# Patient Record
Sex: Female | Born: 1990 | Race: Black or African American | Hispanic: No | Marital: Single | Smoking: Former smoker
Health system: Southern US, Community
[De-identification: ages and names within clinical notes are randomized; demographics above are authoritative.]

## PROBLEM LIST (undated history)

## (undated) ENCOUNTER — Inpatient Hospital Stay (HOSPITAL_COMMUNITY): Payer: Self-pay

## (undated) DIAGNOSIS — Z789 Other specified health status: Secondary | ICD-10-CM

## (undated) HISTORY — PX: NO PAST SURGERIES: SHX2092

---
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
## 2009-01-01 ENCOUNTER — Emergency Department (HOSPITAL_COMMUNITY): Admission: EM | Admit: 2009-01-01 | Discharge: 2009-01-01 | Payer: Self-pay | Admitting: Emergency Medicine

## 2009-09-27 ENCOUNTER — Emergency Department: Payer: Self-pay | Admitting: Emergency Medicine

## 2009-09-29 ENCOUNTER — Inpatient Hospital Stay (HOSPITAL_COMMUNITY): Admission: AD | Admit: 2009-09-29 | Discharge: 2009-09-29 | Payer: Self-pay | Admitting: Obstetrics and Gynecology

## 2009-10-04 ENCOUNTER — Emergency Department: Payer: Self-pay | Admitting: Emergency Medicine

## 2009-10-05 ENCOUNTER — Emergency Department (HOSPITAL_COMMUNITY): Admission: EM | Admit: 2009-10-05 | Discharge: 2009-10-05 | Payer: Self-pay | Admitting: Family Medicine

## 2009-10-06 ENCOUNTER — Emergency Department: Payer: Self-pay | Admitting: Emergency Medicine

## 2009-10-10 ENCOUNTER — Encounter: Payer: Self-pay | Admitting: Family Medicine

## 2009-10-10 ENCOUNTER — Ambulatory Visit: Payer: Self-pay | Admitting: Obstetrics and Gynecology

## 2009-10-10 LAB — CONVERTED CEMR LAB
Antibody Screen: NEGATIVE
Basophils Relative: 0 % (ref 0–1)
Eosinophils Absolute: 0.1 10*3/uL (ref 0.0–0.7)
Hgb A: 97.3 % (ref 96.8–97.8)
Hgb S Quant: 0 % (ref 0.0–0.0)
Lymphs Abs: 1.4 10*3/uL (ref 0.7–4.0)
MCHC: 33.1 g/dL (ref 30.0–36.0)
MCV: 87.1 fL (ref 78.0–100.0)
Monocytes Relative: 5 % (ref 3–12)
Neutrophils Relative %: 75 % (ref 43–77)
RBC: 4.72 M/uL (ref 3.87–5.11)
Rh Type: POSITIVE

## 2009-10-24 ENCOUNTER — Ambulatory Visit: Payer: Self-pay | Admitting: Obstetrics and Gynecology

## 2009-11-23 ENCOUNTER — Encounter: Payer: Self-pay | Admitting: Family Medicine

## 2009-11-23 ENCOUNTER — Ambulatory Visit: Payer: Self-pay | Admitting: Obstetrics & Gynecology

## 2009-12-15 ENCOUNTER — Inpatient Hospital Stay (HOSPITAL_COMMUNITY): Admission: AD | Admit: 2009-12-15 | Discharge: 2009-12-16 | Payer: Self-pay | Admitting: Obstetrics & Gynecology

## 2009-12-15 ENCOUNTER — Ambulatory Visit: Payer: Self-pay | Admitting: Nurse Practitioner

## 2009-12-19 ENCOUNTER — Ambulatory Visit (HOSPITAL_COMMUNITY): Admission: RE | Admit: 2009-12-19 | Discharge: 2009-12-19 | Payer: Self-pay | Admitting: Family Medicine

## 2009-12-26 ENCOUNTER — Ambulatory Visit: Payer: Self-pay | Admitting: Obstetrics & Gynecology

## 2010-01-16 ENCOUNTER — Inpatient Hospital Stay (HOSPITAL_COMMUNITY): Admission: AD | Admit: 2010-01-16 | Discharge: 2010-01-17 | Payer: Self-pay | Admitting: Family Medicine

## 2010-01-16 ENCOUNTER — Ambulatory Visit: Payer: Self-pay | Admitting: Obstetrics and Gynecology

## 2010-01-24 ENCOUNTER — Ambulatory Visit: Payer: Self-pay | Admitting: Obstetrics & Gynecology

## 2010-02-07 ENCOUNTER — Ambulatory Visit: Payer: Self-pay | Admitting: Obstetrics & Gynecology

## 2010-02-22 ENCOUNTER — Encounter: Payer: Self-pay | Admitting: Family Medicine

## 2010-02-22 ENCOUNTER — Ambulatory Visit: Payer: Self-pay | Admitting: Obstetrics and Gynecology

## 2010-02-22 LAB — CONVERTED CEMR LAB
HCT: 36.4 % (ref 36.0–46.0)
MCHC: 31.9 g/dL (ref 30.0–36.0)
MCV: 91.7 fL (ref 78.0–100.0)
Platelets: 198 10*3/uL (ref 150–400)
RDW: 12.9 % (ref 11.5–15.5)

## 2010-03-03 ENCOUNTER — Inpatient Hospital Stay (HOSPITAL_COMMUNITY): Admission: AD | Admit: 2010-03-03 | Discharge: 2010-03-03 | Payer: Self-pay | Admitting: Obstetrics & Gynecology

## 2010-03-03 ENCOUNTER — Ambulatory Visit: Payer: Self-pay | Admitting: Family Medicine

## 2010-03-15 ENCOUNTER — Ambulatory Visit: Payer: Self-pay | Admitting: Obstetrics & Gynecology

## 2010-04-06 ENCOUNTER — Ambulatory Visit: Payer: Self-pay | Admitting: Obstetrics & Gynecology

## 2010-04-13 ENCOUNTER — Ambulatory Visit: Payer: Self-pay | Admitting: Obstetrics & Gynecology

## 2010-04-20 ENCOUNTER — Ambulatory Visit: Payer: Self-pay | Admitting: Obstetrics & Gynecology

## 2010-04-28 ENCOUNTER — Inpatient Hospital Stay (HOSPITAL_COMMUNITY): Admission: AD | Admit: 2010-04-28 | Discharge: 2010-04-30 | Payer: Self-pay | Admitting: Obstetrics and Gynecology

## 2010-04-28 ENCOUNTER — Ambulatory Visit: Payer: Self-pay | Admitting: Obstetrics & Gynecology

## 2010-05-01 ENCOUNTER — Ambulatory Visit: Payer: Self-pay | Admitting: Obstetrics and Gynecology

## 2010-06-14 ENCOUNTER — Ambulatory Visit: Payer: Self-pay | Admitting: Obstetrics & Gynecology

## 2010-07-30 ENCOUNTER — Encounter: Payer: Self-pay | Admitting: Family Medicine

## 2010-09-20 LAB — CBC
HCT: 34.5 % — ABNORMAL LOW (ref 36.0–46.0)
Hemoglobin: 11.6 g/dL — ABNORMAL LOW (ref 12.0–15.0)
MCH: 29.5 pg (ref 26.0–34.0)
MCHC: 33.7 g/dL (ref 30.0–36.0)
MCV: 87.6 fL (ref 78.0–100.0)

## 2010-09-21 LAB — WET PREP, GENITAL
Clue Cells Wet Prep HPF POC: NONE SEEN
Trich, Wet Prep: NONE SEEN

## 2010-09-21 LAB — URINALYSIS, ROUTINE W REFLEX MICROSCOPIC
Bilirubin Urine: NEGATIVE
Glucose, UA: NEGATIVE mg/dL
Ketones, ur: NEGATIVE mg/dL
Specific Gravity, Urine: >1.03 — ABNORMAL HIGH (ref 1.005–1.030)
pH: 6 (ref 5.0–8.0)

## 2010-09-21 LAB — STREP B DNA PROBE: Strep Group B Ag: POSITIVE

## 2010-09-21 LAB — GC/CHLAMYDIA PROBE AMP, GENITAL: Chlamydia, DNA Probe: NEGATIVE

## 2010-09-24 LAB — URINALYSIS, ROUTINE W REFLEX MICROSCOPIC
Hgb urine dipstick: NEGATIVE
Protein, ur: 30 mg/dL — AB
Urobilinogen, UA: 0.2 mg/dL (ref 0.0–1.0)

## 2010-09-24 LAB — URINE MICROSCOPIC-ADD ON

## 2010-09-24 LAB — GC/CHLAMYDIA PROBE AMP, GENITAL: GC Probe Amp, Genital: NEGATIVE

## 2010-09-24 LAB — WET PREP, GENITAL
Trich, Wet Prep: NONE SEEN
Yeast Wet Prep HPF POC: NONE SEEN

## 2010-09-25 LAB — GC/CHLAMYDIA PROBE AMP, GENITAL
Chlamydia, DNA Probe: NEGATIVE
GC Probe Amp, Genital: NEGATIVE

## 2010-09-25 LAB — CBC
HCT: 35.7 % — ABNORMAL LOW (ref 36.0–46.0)
MCHC: 34.5 g/dL (ref 30.0–36.0)
MCV: 90.1 fL (ref 78.0–100.0)
Platelets: 173 10*3/uL (ref 150–400)

## 2010-09-25 LAB — URINALYSIS, ROUTINE W REFLEX MICROSCOPIC
Bilirubin Urine: NEGATIVE
Hgb urine dipstick: NEGATIVE
Protein, ur: 30 mg/dL — AB
Urobilinogen, UA: 0.2 mg/dL (ref 0.0–1.0)

## 2010-09-25 LAB — WET PREP, GENITAL
Trich, Wet Prep: NONE SEEN
Yeast Wet Prep HPF POC: NONE SEEN

## 2010-10-01 LAB — CBC
HCT: 39.7 % (ref 36.0–46.0)
Hemoglobin: 13.4 g/dL (ref 12.0–15.0)
MCV: 89.6 fL (ref 78.0–100.0)
Platelets: 224 10*3/uL (ref 150–400)
WBC: 8.9 10*3/uL (ref 4.0–10.5)

## 2010-10-01 LAB — URINALYSIS, ROUTINE W REFLEX MICROSCOPIC
Bilirubin Urine: NEGATIVE
Ketones, ur: 15 mg/dL — AB
Nitrite: NEGATIVE
Protein, ur: NEGATIVE mg/dL

## 2010-10-16 LAB — URINE MICROSCOPIC-ADD ON

## 2010-10-16 LAB — URINALYSIS, ROUTINE W REFLEX MICROSCOPIC
Glucose, UA: NEGATIVE mg/dL
Hgb urine dipstick: NEGATIVE
Protein, ur: 30 mg/dL — AB
pH: 6 (ref 5.0–8.0)

## 2011-03-30 LAB — POCT URINALYSIS DIP (DEVICE)
Nitrite: NEGATIVE
Protein, ur: 30 — AB
Urobilinogen, UA: 0.2
pH: 5

## 2011-03-30 LAB — WET PREP, GENITAL

## 2011-03-30 LAB — GC/CHLAMYDIA PROBE AMP, GENITAL
Chlamydia, DNA Probe: POSITIVE — AB
GC Probe Amp, Genital: NEGATIVE

## 2012-08-07 ENCOUNTER — Inpatient Hospital Stay (HOSPITAL_COMMUNITY): Payer: Self-pay

## 2012-08-07 ENCOUNTER — Encounter (HOSPITAL_COMMUNITY): Payer: Self-pay | Admitting: Obstetrics and Gynecology

## 2012-08-07 ENCOUNTER — Inpatient Hospital Stay (HOSPITAL_COMMUNITY)
Admission: AD | Admit: 2012-08-07 | Discharge: 2012-08-07 | Disposition: A | Discharge status: Home / Self Care | Payer: Self-pay | Source: Ambulatory Visit | Attending: Obstetrics & Gynecology | Admitting: Obstetrics & Gynecology

## 2012-08-07 DIAGNOSIS — R1031 Right lower quadrant pain: Secondary | ICD-10-CM | POA: Insufficient documentation

## 2012-08-07 DIAGNOSIS — N949 Unspecified condition associated with female genital organs and menstrual cycle: Secondary | ICD-10-CM

## 2012-08-07 DIAGNOSIS — N910 Primary amenorrhea: Secondary | ICD-10-CM

## 2012-08-07 DIAGNOSIS — N938 Other specified abnormal uterine and vaginal bleeding: Secondary | ICD-10-CM | POA: Insufficient documentation

## 2012-08-07 DIAGNOSIS — R109 Unspecified abdominal pain: Secondary | ICD-10-CM

## 2012-08-07 DIAGNOSIS — M7918 Myalgia, other site: Secondary | ICD-10-CM

## 2012-08-07 HISTORY — DX: Other specified health status: Z78.9

## 2012-08-07 LAB — CBC
HCT: 44.5 % (ref 36.0–46.0)
MCHC: 33.3 g/dL (ref 30.0–36.0)
Platelets: 229 10*3/uL (ref 150–400)
RDW: 12.4 % (ref 11.5–15.5)
WBC: 6.3 10*3/uL (ref 4.0–10.5)

## 2012-08-07 LAB — URINALYSIS, ROUTINE W REFLEX MICROSCOPIC
Hgb urine dipstick: NEGATIVE
Nitrite: NEGATIVE
Protein, ur: NEGATIVE mg/dL
Specific Gravity, Urine: >1.03 — ABNORMAL HIGH (ref 1.005–1.030)
Urobilinogen, UA: 0.2 mg/dL (ref 0.0–1.0)

## 2012-08-07 LAB — POCT PREGNANCY, URINE: Preg Test, Ur: NEGATIVE

## 2012-08-07 LAB — WET PREP, GENITAL

## 2012-08-07 MED ORDER — IBUPROFEN 600 MG PO TABS: 600.0000 mg | ORAL_TABLET | Freq: Four times a day (QID) | ORAL | Status: DC | PRN

## 2012-08-07 NOTE — MAU Note (Signed)
Pt states right sided lower abd pain radiating into low back. Denies bleeding or abnormal vaginal discharge. Denies uti s/s. LMP-06/24/2012, doesn't think she's pregnant, has been stressed out a lot.

## 2012-08-07 NOTE — MAU Provider Note (Signed)
History     CSN: [id number]  Arrival date and time: 08/07/12 1250   First Provider Initiated Contact with Patient 08/07/12 1402      Chief Complaint  Patient presents with  . Back Pain  . Abdominal Pain   HPI  The patient is a 22 y/o female that presents to the MAU with an abdominal/flank pain that has persisted for the last 2 days.  The patient states that her LMP was June 21, 2012 and she first thought she was getting her period.  The patient states that the pain is constant and she rates it as a 4/10.  The patient is located in the RLQ and the right lumbar region.  The patient has taken one 800 mg ibuprofen 2 days ago to no relief.  Nothing makes the pain better but movement and coughing makes it worse.  The patient has not had any bleeding or discharge associated with the pain.  The patient has had no nausea and vomiting associated with the pain.  She reports no fever, chills, night sweats, diplopia, H/A, dizziness, lightheadedness, rashes, dysuria, or constipation.    The patient indicates that she has been under a lot of stress lately and this may cause delay in her menses.  The patient states that she is not pregnant but does not use birth control.  Her last intercourse was 10 days ago.  She indicates no history of STI's.  OB History    Grav Para Term Preterm Abortions TAB SAB Ect Mult Living   2 2 2       2       Past Medical History  Diagnosis Date  . No pertinent past medical history     Past Surgical History  Procedure Date  . No past surgeries     Family History  Problem Relation Age of Onset  . Asthma Sister   . Diabetes Paternal Aunt     History  Substance Use Topics  . Smoking status: Current Every Day Smoker -- 0.2 packs/day for 3 years  . Smokeless tobacco: Not on file  . Alcohol Use: 4.8 oz/week    8 Glasses of wine per week    Allergies:  Allergies  Allergen Reactions  . Bee Venom     Family history of reaction to bee stings    No  prescriptions prior to admission    ROS Physical Exam   Blood pressure 123/79, pulse 87, temperature 98 F (36.7 C), temperature source Oral, resp. rate 16, height 5\' 1"  (1.549 m), weight 140 lb (63.504 kg), last menstrual period 06/24/2012.  Physical Exam  Eyes: PERRLA Heart: RRR, no extra sounds heart Lungs: clear and equal breath sounds in all quadrants ABD: no distention/masses/rashes noted.  BS + in all four quadrants.   Mild enderness to light and deep palpation in the RLQ. No rebound tenderness, no guarding.   M/S: Negative CVA tenderness but some generalized tenderness of right lower back P/V: 2+ pulses in all extremities without edema Pelvic: no lesions noted on the external genitalia.  Cervical os closed with physiologic white discharge in the fornix.  Bimanual: mild tenderness to palpation around the uterus along with mild right adnexal tenderness. No tenderness on the left Neuro: CN II-XII grossly intact Results for orders placed during the hospital encounter of 08/07/12 (from the past 24 hour(s))  URINALYSIS, ROUTINE W REFLEX MICROSCOPIC     Status: Abnormal   Collection Time   08/07/12  1:17 PM  Component Value Range   Color, Urine YELLOW  YELLOW   APPearance CLEAR  CLEAR   Specific Gravity, Urine >1.030 (*) 1.005 - 1.030   pH 6.0  5.0 - 8.0   Glucose, UA NEGATIVE  NEGATIVE mg/dL   Hgb urine dipstick NEGATIVE  NEGATIVE   Bilirubin Urine NEGATIVE  NEGATIVE   Ketones, ur NEGATIVE  NEGATIVE mg/dL   Protein, ur NEGATIVE  NEGATIVE mg/dL   Urobilinogen, UA 0.2  0.0 - 1.0 mg/dL   Nitrite NEGATIVE  NEGATIVE   Leukocytes, UA NEGATIVE  NEGATIVE  POCT PREGNANCY, URINE     Status: Normal   Collection Time   08/07/12  1:27 PM      Component Value Range   Preg Test, Ur NEGATIVE  NEGATIVE  CBC     Status: Normal   Collection Time   08/07/12  1:55 PM      Component Value Range   WBC 6.3  4.0 - 10.5 K/uL   RBC 4.91  3.87 - 5.11 MIL/uL   Hemoglobin 14.8  12.0 - 15.0 g/dL    HCT 16.1  09.6 - 04.5 %   MCV 90.6  78.0 - 100.0 fL   MCH 30.1  26.0 - 34.0 pg   MCHC 33.3  30.0 - 36.0 g/dL   RDW 40.9  81.1 - 91.4 %   Platelets 229  150 - 400 K/uL  WET PREP, GENITAL     Status: Abnormal   Collection Time   08/07/12  2:15 PM      Component Value Range   Yeast Wet Prep HPF POC NONE SEEN  NONE SEEN   Trich, Wet Prep NONE SEEN  NONE SEEN   Clue Cells Wet Prep HPF POC FEW (*) NONE SEEN   WBC, Wet Prep HPF POC FEW (*) NONE SEEN    *RADIOLOGY REPORT*  Clinical Data: Right lower quadrant pain for 2 days. Delayed  menses with negative pregnancy test. LMP 06/21/2012  TRANSABDOMINAL AND TRANSVAGINAL ULTRASOUND OF PELVIS  Technique: Both transabdominal and transvaginal ultrasound  examinations of the pelvis were performed. Transabdominal technique  was performed for global imaging of the pelvis including uterus,  ovaries, adnexal regions, and pelvic cul-de-sac.  It was necessary to proceed with endovaginal exam following the  transabdominal exam to visualize the endometrium and adnexa.  Comparison: None  Findings:  Uterus: Is anteverted and anteflexed and demonstrates a sagittal  length of 7.6 cm, depth of 4.0 cm and width 4.3 cm. A homogeneous  myometrium is seen  Endometrium: Appears trilayered with a width of 7.2 mm. No areas  of focal thickening or heterogeneity are seen  Right ovary: Has a normal appearance measuring 3.6 x 2.8 x 1.6 cm  Left ovary: Has a normal appearance measuring 3.2 x 1.8 x 2.5 cm  Other findings: A small amount of simple free fluid is noted in  the cul-de-sac. No separate adnexal masses are seen  IMPRESSION:  Normal pelvic ultrasound.  Original Report Authenticated By: Rhodia Albright, M. MAU Course  Procedures -Urine Pregnancy test -CBC - Pelvic exam - Pelvic Ultrasound - GC/Chlamydia   Assessment and Plan   1. Delayed menses   2. Abdominal pain in female patient   3. Musculoskeletal pain     Plan: Patient was given a week  supply of ibuprofen (600 mg) for pain.  If her pain increases or persists in the next week she was instructed to return to the MAU.  If menses remains delayed, she was instructed  to take another home urine pregnancy test and/or return to MAU.  She was also advised to follow up at the clinic for routine GYN care. The patient understands the information that was provided to her.  Evalee Mutton 08/07/2012, 2:19 PM   I have seen this patient and agree with the above resident's note. Low suspicion for appendicitis or torsion r/t mildness of pain, no rebound tenderness, and no n/v, fever/chills, or WBCs.  Pt given good precautions on reasons to return to MAU. Pt reports improvement in symptoms in MAU without medications.  LEFTWICH-KIRBY, Vasilia Dise Certified Nurse-Midwife

## 2012-08-07 NOTE — MAU Note (Signed)
"  Two days ago, I started having cramps in the morning when I woke up.  It felt like I was about to come on my period.  No N/V/D.  I took a 800mg  Motrin the same day it started, but it didn't help it.  I haven't taken anything else.  I don't think I'm pregnant."

## 2012-08-09 LAB — GC/CHLAMYDIA PROBE AMP: GC Probe RNA: NEGATIVE

## 2012-08-12 ENCOUNTER — Encounter (HOSPITAL_COMMUNITY): Payer: Self-pay | Admitting: *Deleted

## 2013-07-26 ENCOUNTER — Emergency Department (HOSPITAL_COMMUNITY)
Admission: EM | Admit: 2013-07-26 | Discharge: 2013-07-26 | Disposition: A | Discharge status: Home / Self Care | Payer: Self-pay | Attending: Emergency Medicine | Admitting: Emergency Medicine

## 2013-07-26 ENCOUNTER — Encounter (HOSPITAL_COMMUNITY): Payer: Self-pay | Admitting: Emergency Medicine

## 2013-07-26 DIAGNOSIS — S0181XA Laceration without foreign body of other part of head, initial encounter: Secondary | ICD-10-CM

## 2013-07-26 DIAGNOSIS — Y9389 Activity, other specified: Secondary | ICD-10-CM | POA: Insufficient documentation

## 2013-07-26 DIAGNOSIS — IMO0002 Reserved for concepts with insufficient information to code with codable children: Secondary | ICD-10-CM | POA: Insufficient documentation

## 2013-07-26 DIAGNOSIS — Y929 Unspecified place or not applicable: Secondary | ICD-10-CM | POA: Insufficient documentation

## 2013-07-26 DIAGNOSIS — F17200 Nicotine dependence, unspecified, uncomplicated: Secondary | ICD-10-CM | POA: Insufficient documentation

## 2013-07-26 DIAGNOSIS — S01501A Unspecified open wound of lip, initial encounter: Secondary | ICD-10-CM | POA: Insufficient documentation
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
## 2013-07-26 NOTE — ED Notes (Signed)
Pt is very tearful and upset. Unknown reason. Pt refuses to tell. Will continue to monitor.

## 2013-07-26 NOTE — ED Notes (Signed)
Laceration is cleaned.

## 2013-07-26 NOTE — ED Notes (Signed)
PA at bedside performing suture care. Pt tolerating without difficulty. No signs of acute distress noted.

## 2013-07-26 NOTE — Discharge Instructions (Signed)
Have sutures out in 5 days.  Keep area clean with soap and water.  Return here as needed
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
## 2013-07-29 NOTE — ED Provider Notes (Signed)
Medical screening examination/treatment/procedure(s) were performed by non-physician practitioner and as supervising physician I was immediately available for consultation/collaboration.  EKG Interpretation   None         Alaric Gladwin, MD 07/29/13 1507 

## 2013-08-01 ENCOUNTER — Emergency Department (INDEPENDENT_AMBULATORY_CARE_PROVIDER_SITE_OTHER)
Admission: EM | Admit: 2013-08-01 | Discharge: 2013-08-01 | Disposition: A | Discharge status: Home / Self Care | Payer: Self-pay | Source: Home / Self Care | Attending: Emergency Medicine | Admitting: Emergency Medicine

## 2013-08-01 ENCOUNTER — Encounter (HOSPITAL_COMMUNITY): Payer: Self-pay | Admitting: Emergency Medicine

## 2013-08-01 DIAGNOSIS — Z4802 Encounter for removal of sutures: Secondary | ICD-10-CM

## 2013-08-01 DIAGNOSIS — IMO0002 Reserved for concepts with insufficient information to code with codable children: Secondary | ICD-10-CM

## 2013-08-01 DIAGNOSIS — T148XXA Other injury of unspecified body region, initial encounter: Secondary | ICD-10-CM

## 2013-08-01 NOTE — Discharge Instructions (Signed)
Wash with soap and water and apply antibiotic ointment 3 times daily for next 3 days.  Rinse mouth with warm salt water 3 times daily.

## 2013-08-01 NOTE — ED Notes (Signed)
Pt is here for a f/u and to have stitches removed above lip Voices no new concerns... Alert w/no signs of acute distress.
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
## 2014-04-20 ENCOUNTER — Emergency Department (HOSPITAL_COMMUNITY): Payer: Self-pay

## 2014-04-20 ENCOUNTER — Emergency Department (HOSPITAL_COMMUNITY)
Admission: EM | Admit: 2014-04-20 | Discharge: 2014-04-20 | Disposition: A | Discharge status: Home / Self Care | Payer: Self-pay | Attending: Emergency Medicine | Admitting: Emergency Medicine

## 2014-04-20 DIAGNOSIS — Y9389 Activity, other specified: Secondary | ICD-10-CM | POA: Insufficient documentation

## 2014-04-20 DIAGNOSIS — S8002XA Contusion of left knee, initial encounter: Secondary | ICD-10-CM | POA: Insufficient documentation

## 2014-04-20 DIAGNOSIS — W010XXA Fall on same level from slipping, tripping and stumbling without subsequent striking against object, initial encounter: Secondary | ICD-10-CM | POA: Insufficient documentation

## 2014-04-20 DIAGNOSIS — Y990 Civilian activity done for income or pay: Secondary | ICD-10-CM | POA: Insufficient documentation

## 2014-04-20 DIAGNOSIS — T07XXXA Unspecified multiple injuries, initial encounter: Secondary | ICD-10-CM

## 2014-04-20 DIAGNOSIS — Y9289 Other specified places as the place of occurrence of the external cause: Secondary | ICD-10-CM | POA: Insufficient documentation

## 2014-04-20 DIAGNOSIS — W19XXXA Unspecified fall, initial encounter: Secondary | ICD-10-CM

## 2014-04-20 DIAGNOSIS — S90512A Abrasion, left ankle, initial encounter: Secondary | ICD-10-CM | POA: Insufficient documentation

## 2014-04-20 DIAGNOSIS — Z720 Tobacco use: Secondary | ICD-10-CM | POA: Insufficient documentation

## 2014-04-20 MED ORDER — IBUPROFEN 800 MG PO TABS: 800.0000 mg | ORAL_TABLET | Freq: Three times a day (TID) | ORAL | Status: DC
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
## 2014-04-20 NOTE — Discharge Instructions (Signed)
Contusion °A contusion is a deep bruise. Contusions are the result of an injury that caused bleeding under the skin. The contusion may turn blue, purple, or yellow. Minor injuries will give you a painless contusion, but more severe contusions may stay painful and swollen for a few weeks.  °CAUSES  °A contusion is usually caused by a blow, trauma, or direct force to an area of the body. °SYMPTOMS  °· Swelling and redness of the injured area. °· Bruising of the injured area. °· Tenderness and soreness of the injured area. °· Pain. °DIAGNOSIS  °The diagnosis can be made by taking a history and physical exam. An X-ray, CT scan, or MRI may be needed to determine if there were any associated injuries, such as fractures. °TREATMENT  °Specific treatment will depend on what area of the body was injured. In general, the best treatment for a contusion is resting, icing, elevating, and applying cold compresses to the injured area. Over-the-counter medicines may also be recommended for pain control. Ask your caregiver what the best treatment is for your contusion. °HOME CARE INSTRUCTIONS  °· Put ice on the injured area. °· Put ice in a plastic bag. °· Place a towel between your skin and the bag. °· Leave the ice on for 15-20 minutes, 3-4 times a day, or as directed by your health care provider. °· Only take over-the-counter or prescription medicines for pain, discomfort, or fever as directed by your caregiver. Your caregiver may recommend avoiding anti-inflammatory medicines (aspirin, ibuprofen, and naproxen) for 48 hours because these medicines may increase bruising. °· Rest the injured area. °· If possible, elevate the injured area to reduce swelling. °SEEK IMMEDIATE MEDICAL CARE IF:  °· You have increased bruising or swelling. °· You have pain that is getting worse. °· Your swelling or pain is not relieved with medicines. °MAKE SURE YOU:  °· Understand these instructions. °· Will watch your condition. °· Will get help right  away if you are not doing well or get worse. °Document Released: 04/04/2005 Document Revised: 06/30/2013 Document Reviewed: 04/30/2011 °ExitCare® Patient Information ©2015 [employer]. This information is not intended to replace advice given to you by your health care provider. Make sure you discuss any questions you have with your health care provider. ° °Abrasion °An abrasion is a cut or scrape of the skin. Abrasions do not extend through all layers of the skin and most heal within 10 days. It is important to care for your abrasion properly to prevent infection. °CAUSES  °Most abrasions are caused by falling on, or gliding across, the ground or other surface. When your skin rubs on something, the outer and inner layer of skin rubs off, causing an abrasion. °DIAGNOSIS  °Your caregiver will be able to diagnose an abrasion during a physical exam.  °TREATMENT  °Your treatment depends on how large and deep the abrasion is. Generally, your abrasion will be cleaned with water and a mild soap to remove any dirt or debris. An antibiotic ointment may be put over the abrasion to prevent an infection. A bandage (dressing) may be wrapped around the abrasion to keep it from getting dirty.  °You may need a tetanus shot if: °· You cannot remember when you had your last tetanus shot. °· You have never had a tetanus shot. °· The injury broke your skin. °If you get a tetanus shot, your arm may swell, get red, and feel warm to the touch. This is common and not a problem. If you need a   tetanus shot and you choose not to have one, there is a rare chance of getting tetanus. Sickness from tetanus can be serious.  HOME CARE INSTRUCTIONS   If a dressing was applied, change it at least once a day or as directed by your caregiver. If the bandage sticks, soak it off with warm water.   Wash the area with water and a mild soap to remove all the ointment 2 times a day. Rinse off the soap and pat the area dry with a clean towel.    Reapply any ointment as directed by your caregiver. This will help prevent infection and keep the bandage from sticking. Use gauze over the wound and under the dressing to help keep the bandage from sticking.   Change your dressing right away if it becomes wet or dirty.   Only take over-the-counter or prescription medicines for pain, discomfort, or fever as directed by your caregiver.   Follow up with your caregiver within 24-48 hours for a wound check, or as directed. If you were not given a wound-check appointment, look closely at your abrasion for redness, swelling, or pus. These are signs of infection. SEEK IMMEDIATE MEDICAL CARE IF:   You have increasing pain in the wound.   You have redness, swelling, or tenderness around the wound.   You have pus coming from the wound.   You have a fever or persistent symptoms for more than 2-3 days.  You have a fever and your symptoms suddenly get worse.  You have a bad smell coming from the wound or dressing.  MAKE SURE YOU:   Understand these instructions.  Will watch your condition.  Will get help right away if you are not doing well or get worse. Document Released: 04/04/2005 Document Revised: 06/11/2012 Document Reviewed: 05/29/2011 [facility] Patient Information 2015 [place] GardensExitCare, [place]. This information is not intended to replace advice given to you by your health care provider. Make sure you discuss any questions you have with your health care provider. Cryotherapy Cryotherapy means treatment with cold. Ice or gel packs can be used to reduce both pain and swelling. Ice is the most helpful within the first 24 to 48 hours after an injury or flare-up from overusing a muscle or joint. Sprains, strains, spasms, burning pain, shooting pain, and aches can all be eased with ice. Ice can also be used when recovering from surgery. Ice is effective, has very few side effects, and is safe for most people to use. PRECAUTIONS  Ice is not a  safe treatment option for people with:  Raynaud phenomenon. This is a condition affecting small blood vessels in the extremities. Exposure to cold may cause your problems to return.  Cold hypersensitivity. There are many forms of cold hypersensitivity, including:  Cold urticaria. Red, itchy hives appear on the skin when the tissues begin to warm after being iced.  Cold erythema. This is a red, itchy rash caused by exposure to cold.  Cold hemoglobinuria. Red blood cells break down when the tissues begin to warm after being iced. The hemoglobin that carry oxygen are passed into the urine because they cannot combine with blood proteins fast enough.  Numbness or altered sensitivity in the area being iced. If you have any of the following conditions, do not use ice until you have discussed cryotherapy with your caregiver:  Heart conditions, such as arrhythmia, angina, or chronic heart disease.  High blood pressure.  Healing wounds or open skin in the area being iced.  Current infections.  Rheumatoid arthritis.  Poor circulation.  Diabetes. Ice slows the blood flow in the region it is applied. This is beneficial when trying to stop inflamed tissues from spreading irritating chemicals to surrounding tissues. However, if you expose your skin to cold temperatures for too long or without the proper protection, you can damage your skin or nerves. Watch for signs of skin damage due to cold. HOME CARE INSTRUCTIONS Follow these tips to use ice and cold packs safely.  Place a dry or damp towel between the ice and skin. A damp towel will cool the skin more quickly, so you may need to shorten the time that the ice is used.  For a more rapid response, add gentle compression to the ice.  Ice for no more than 10 to 20 minutes at a time. The bonier the area you are icing, the less time it will take to get the benefits of ice.  Check your skin after 5 minutes to make sure there are no signs of a poor  response to cold or skin damage.  Rest 20 minutes or more between uses.  Once your skin is numb, you can end your treatment. You can test numbness by very lightly touching your skin. The touch should be so light that you do not see the skin dimple from the pressure of your fingertip. When using ice, most people will feel these normal sensations in this order: cold, burning, aching, and numbness.  Do not use ice on someone who cannot communicate their responses to pain, such as small children or people with dementia. HOW TO MAKE AN ICE PACK Ice packs are the most common way to use ice therapy. Other methods include ice massage, ice baths, and cryosprays. Muscle creams that cause a cold, tingly feeling do not offer the same benefits that ice offers and should not be used as a substitute unless recommended by your caregiver. To make an ice pack, do one of the following:  Place crushed ice or a bag of frozen vegetables in a sealable plastic bag. Squeeze out the excess air. Place this bag inside another plastic bag. Slide the bag into a pillowcase or place a damp towel between your skin and the bag.  Mix 3 parts water with 1 part rubbing alcohol. Freeze the mixture in a sealable plastic bag. When you remove the mixture from the freezer, it will be slushy. Squeeze out the excess air. Place this bag inside another plastic bag. Slide the bag into a pillowcase or place a damp towel between your skin and the bag. SEEK MEDICAL CARE IF:  You develop white spots on your skin. This may give the skin a blotchy (mottled) appearance.  Your skin turns blue or pale.  Your skin becomes waxy or hard.  Your swelling gets worse. MAKE SURE YOU:   Understand these instructions.  Will watch your condition.  Will get help right away if you are not doing well or get worse. Document Released: 02/19/2011 Document Revised: 11/09/2013 Document Reviewed: 02/19/2011 [facility] LPExitCare Patient Information 2015 [place]ExitCare, MarylandLLC.  This information is not intended to replace advice given to you by your health care provider. Make sure you discuss any questions you have with your health care provider.

## 2014-04-20 NOTE — Discharge Planning (Signed)
[facility]  Resource guide and my contact information left in the patients room, pt in xray per RN.

## 2014-04-21 NOTE — ED Provider Notes (Signed)
Medical screening examination/treatment/procedure(s) were performed by non-physician practitioner and as supervising physician I was immediately available for consultation/collaboration.   EKG Interpretation None        Decorian Schuenemann M Deven Audi, MD 04/21/14 1939 

## 2014-05-10 ENCOUNTER — Encounter (HOSPITAL_COMMUNITY): Payer: Self-pay | Admitting: Emergency Medicine

## 2015-09-03 ENCOUNTER — Emergency Department (HOSPITAL_COMMUNITY)
Admission: EM | Admit: 2015-09-03 | Discharge: 2015-09-04 | Disposition: A | Discharge status: Home / Self Care | Payer: Self-pay | Attending: Emergency Medicine | Admitting: Emergency Medicine

## 2015-09-03 ENCOUNTER — Emergency Department (HOSPITAL_COMMUNITY): Payer: Self-pay

## 2015-09-03 ENCOUNTER — Encounter (HOSPITAL_COMMUNITY): Payer: Self-pay | Admitting: Emergency Medicine

## 2015-09-03 DIAGNOSIS — E860 Dehydration: Secondary | ICD-10-CM | POA: Insufficient documentation

## 2015-09-03 DIAGNOSIS — R000 Tachycardia, unspecified: Secondary | ICD-10-CM | POA: Insufficient documentation

## 2015-09-03 DIAGNOSIS — F17200 Nicotine dependence, unspecified, uncomplicated: Secondary | ICD-10-CM | POA: Insufficient documentation

## 2015-09-03 DIAGNOSIS — R1031 Right lower quadrant pain: Secondary | ICD-10-CM | POA: Insufficient documentation

## 2015-09-03 DIAGNOSIS — Z3202 Encounter for pregnancy test, result negative: Secondary | ICD-10-CM | POA: Insufficient documentation

## 2015-09-03 DIAGNOSIS — R112 Nausea with vomiting, unspecified: Secondary | ICD-10-CM | POA: Insufficient documentation

## 2015-09-03 DIAGNOSIS — R05 Cough: Secondary | ICD-10-CM | POA: Insufficient documentation

## 2015-09-03 DIAGNOSIS — Z792 Long term (current) use of antibiotics: Secondary | ICD-10-CM | POA: Insufficient documentation

## 2015-09-03 LAB — CBC
HEMATOCRIT: 43.6 % (ref 36.0–46.0)
Hemoglobin: 14.8 g/dL (ref 12.0–15.0)
MCH: 30.6 pg (ref 26.0–34.0)
MCHC: 33.9 g/dL (ref 30.0–36.0)
MCV: 90.1 fL (ref 78.0–100.0)
Platelets: 227 10*3/uL (ref 150–400)
RBC: 4.84 MIL/uL (ref 3.87–5.11)
RDW: 12 % (ref 11.5–15.5)
WBC: 8.1 10*3/uL (ref 4.0–10.5)

## 2015-09-03 LAB — URINE MICROSCOPIC-ADD ON

## 2015-09-03 LAB — COMPREHENSIVE METABOLIC PANEL
ALBUMIN: 4.4 g/dL (ref 3.5–5.0)
ALT: 30 U/L (ref 14–54)
AST: 67 U/L — ABNORMAL HIGH (ref 15–41)
Alkaline Phosphatase: 52 U/L (ref 38–126)
Anion gap: 20 — ABNORMAL HIGH (ref 5–15)
BUN: 8 mg/dL (ref 6–20)
CHLORIDE: 97 mmol/L — AB (ref 101–111)
CO2: 21 mmol/L — ABNORMAL LOW (ref 22–32)
Calcium: 9.5 mg/dL (ref 8.9–10.3)
Creatinine, Ser: 0.9 mg/dL (ref 0.44–1.00)
GFR calc Af Amer: >60 mL/min (ref >60)
GFR calc non Af Amer: >60 mL/min (ref >60)
Glucose, Bld: 78 mg/dL (ref 65–99)
POTASSIUM: 3.3 mmol/L — AB (ref 3.5–5.1)
Sodium: 138 mmol/L (ref 135–145)
Total Bilirubin: 1.1 mg/dL (ref 0.3–1.2)
Total Protein: 7.7 g/dL (ref 6.5–8.1)

## 2015-09-03 LAB — LIPASE, BLOOD: Lipase: 23 U/L (ref 11–51)

## 2015-09-03 LAB — I-STAT BETA HCG BLOOD, ED (MC, WL, AP ONLY): I-stat hCG, quantitative: <5 m[IU]/mL (ref <5)

## 2015-09-03 LAB — URINALYSIS, ROUTINE W REFLEX MICROSCOPIC
BILIRUBIN URINE: NEGATIVE
GLUCOSE, UA: 100 mg/dL — AB
KETONES UR: NEGATIVE mg/dL
Leukocytes, UA: NEGATIVE
Nitrite: NEGATIVE
PH: 6.5 (ref 5.0–8.0)
Protein, ur: NEGATIVE mg/dL
Specific Gravity, Urine: 1.008 (ref 1.005–1.030)

## 2015-09-03 MED ORDER — DEXTROSE 5 % IV BOLUS
500.0000 mL | Freq: Once | INTRAVENOUS | Status: AC
Start: 2015-09-03 — End: 2015-09-03
  Administered 2015-09-03: 500 mL via INTRAVENOUS

## 2015-09-03 MED ORDER — ONDANSETRON HCL 4 MG/2ML IJ SOLN
4.0000 mg | Freq: Once | INTRAMUSCULAR | Status: AC
  Administered 2015-09-03: 4 mg via INTRAVENOUS
  Filled 2015-09-03: qty 2

## 2015-09-03 MED ORDER — ONDANSETRON HCL 4 MG PO TABS: 4.0000 mg | ORAL_TABLET | Freq: Three times a day (TID) | ORAL | Status: AC | PRN

## 2015-09-03 MED ORDER — IOHEXOL 300 MG/ML  SOLN
80.0000 mL | Freq: Once | INTRAMUSCULAR | Status: AC | PRN
  Administered 2015-09-03: 100 mL via INTRAVENOUS

## 2015-09-03 MED ORDER — SODIUM CHLORIDE 0.9 % IV BOLUS (SEPSIS)
1000.0000 mL | Freq: Once | INTRAVENOUS | Status: AC
  Administered 2015-09-03: 1000 mL via INTRAVENOUS

## 2015-09-03 NOTE — ED Provider Notes (Signed)
CSN: [id number]     Arrival date & time 09/03/15  1802 History   First MD Initiated Contact with Patient 09/03/15 1901     Chief Complaint  Patient presents with  . Emesis     (Consider location/radiation/quality/duration/timing/severity/associated sxs/prior Treatment) Patient is a 25 y.o. female presenting with vomiting.  Emesis Severity:  Moderate Duration:  4 days Timing:  Intermittent Quality:  Stomach contents Feeding tolerance: nothing. How soon after eating does vomiting occur:  1 hour Progression:  Worsening Chronicity:  New Recent urination:  Decreased Context: not self-induced   Relieved by:  Nothing Worsened by:  Nothing tried Ineffective treatments:  None tried Associated symptoms: abdominal pain and cough   Associated symptoms: no chills, no diarrhea, no fever, no headaches and no sore throat   Risk factors: no diabetes, not pregnant now, no prior abdominal surgery, no sick contacts, no suspect food intake and no travel to endemic areas     Past Medical History  Diagnosis Date  . No pertinent past medical history    Past Surgical History  Procedure Laterality Date  . No past surgeries     Family History  Problem Relation Age of Onset  . Asthma Sister   . Diabetes Paternal Aunt    Social History  Substance Use Topics  . Smoking status: Current Every Day Smoker -- 0.25 packs/day for 3 years  . Smokeless tobacco: None  . Alcohol Use: 4.8 oz/week    8 Glasses of wine per week   OB History    Gravida Para Term Preterm AB TAB SAB Ectopic Multiple Living   Review of Systems  Constitutional: Negative for fever, chills, appetite change and fatigue.  HENT: Negative for congestion, ear pain, facial swelling, mouth sores and sore throat.   Eyes: Negative for visual disturbance.  Respiratory: Negative for cough, chest tightness and shortness of breath.   Cardiovascular: Negative for chest pain and palpitations.  Gastrointestinal: Positive  for vomiting and abdominal pain. Negative for nausea, diarrhea and blood in stool.  Endocrine: Negative for cold intolerance and heat intolerance.  Genitourinary: Negative for frequency, decreased urine volume and difficulty urinating.  Musculoskeletal: Negative for back pain and neck stiffness.  Skin: Negative for rash.  Neurological: Negative for dizziness, weakness, light-headedness and headaches.  All other systems reviewed and are negative.     Allergies  Review of patient's allergies indicates no active allergies.  Home Medications   Prior to Admission medications   Medication Sig Start Date End Date Taking? Authorizing Provider  Multiple Vitamin (MULTIVITAMIN WITH MINERALS) TABS tablet Take 1 tablet by mouth daily as needed (takes occasionally).   Yes Historical Provider, MD  ibuprofen (ADVIL,MOTRIN) 800 MG tablet Take 1 tablet (800 mg total) by mouth 3 (three) times daily. 04/20/14   Elpidio Anis, PA-C  ondansetron (ZOFRAN) 4 MG tablet Take 1 tablet (4 mg total) by mouth every 8 (eight) hours as needed for nausea or vomiting. 09/04/15 09/07/15  Drema Pry, MD   BP 134/75 mmHg  Pulse 95  Temp(Src) 99.6 F (37.6 C) (Oral)  Resp 20  Ht  (1.549 m)  Wt 68.04 kg  BMI 28.36 kg/m2  SpO2 100%  LMP 09/03/2015 Physical Exam  Constitutional: She is oriented to person, place, and time. She appears well-developed and well-nourished. No distress.  HENT:  Head: Normocephalic and atraumatic.  Right Ear: External ear normal.  Left Ear: External ear normal.  Nose: Nose normal.  Eyes: Conjunctivae and EOM are normal. Pupils are equal, round, and reactive to light. Right eye exhibits no discharge. Left eye exhibits no discharge. No scleral icterus.  Neck: Normal range of motion. Neck supple.  Cardiovascular: Normal rate, regular rhythm and normal heart sounds.  Exam reveals no gallop and no friction rub.   No murmur heard. Pulmonary/Chest: Effort normal and breath sounds normal.  No stridor. No respiratory distress. She has no wheezes.  Abdominal: Soft. She exhibits no distension. There is no tenderness.  Musculoskeletal: She exhibits no edema or tenderness.  Neurological: She is alert and oriented to person, place, and time.  Skin: Skin is warm and dry. No rash noted. She is not diaphoretic. No erythema.  Psychiatric: She has a normal mood and affect.    ED Course  Procedures (including critical care time) Labs Review Labs Reviewed  COMPREHENSIVE METABOLIC PANEL - Abnormal; Notable for the following:    Potassium 3.3 (*)    Chloride 97 (*)    CO2 21 (*)    AST 67 (*)    Anion gap 20 (*)    All other components within normal limits  URINALYSIS, ROUTINE W REFLEX MICROSCOPIC (NOT AT [facility]) - Abnormal; Notable for the following:    Glucose, UA 100 (*)    Hgb urine dipstick LARGE (*)    All other components within normal limits  URINE MICROSCOPIC-ADD ON - Abnormal; Notable for the following:    Squamous Epithelial / LPF 0-5 (*)    Bacteria, UA RARE (*)    All other components within normal limits  LIPASE, BLOOD  CBC  I-STAT BETA HCG BLOOD, ED ([facility], [facility], [facility] ONLY)    Imaging Review Ct Abdomen Pelvis W Contrast  09/03/2015  CLINICAL DATA:  Patient with vomiting for 4 days. Right lower quadrant abdominal pain. EXAM: CT ABDOMEN AND PELVIS WITH CONTRAST TECHNIQUE: Multidetector CT imaging of the abdomen and pelvis was performed using the standard protocol following bolus administration of intravenous contrast. CONTRAST:  OMNIPAQUE IOHEXOL 300 MG/ML  SOLN COMPARISON:  Ultrasound pelvis 08/07/2012 FINDINGS: Lower chest: Normal heart size. No pericardial effusion. Lung bases are unremarkable. No pleural effusion. Hepatobiliary: Liver is normal in size and contour. No intrahepatic or extrahepatic biliary ductal dilatation. Gallbladder is unremarkable. Pancreas: Unremarkable Spleen: Unremarkable Adrenals/Urinary Tract: Adrenal glands are normal. Kidneys enhance  symmetrically with contrast. No hydronephrosis. Urinary bladder is unremarkable. Stomach/Bowel: No abnormal bowel wall thickening or evidence for bowel obstruction. No free fluid or free intraperitoneal air. The appendix is normal. Normal morphology of the stomach. Vascular/Lymphatic: Normal caliber abdominal aorta. No retroperitoneal lymphadenopathy. Other: Uterus and adnexal structures are unremarkable. Musculoskeletal: No aggressive or acute appearing osseous lesions. IMPRESSION: No acute process within the abdomen or pelvis.  Normal appendix. Electronically Signed   By: Annia Belt M.D.   On: 09/03/2015 20:51   I have personally reviewed and evaluated these images and lab results as part of my medical decision-making.   EKG Interpretation None      MDM   25 year old female presents with 4 days of nausea and vomiting. Rest of the history as above. Next  On arrival patient has a low-grade temperature 99.6 degrees, normotensive, mildly tachycardic at 103. She is ill-appearing with evidence of mild dehydration but nontoxic. Abdomen with significant tenderness in the right lower quadrant and right flank. Rest of the exam as above.  Workup revealed anion gap, with hypochloremia likely secondary to emesis. UPT negative. No evidence of pancreatitis.  No leukocytosis on CBC. However given the anion gap with the significant abdominal tenderness we'll obtain a CT to rule out any intra-abdominal infection or inflammation. CT abdomen negative. Urine without evidence of infection. Patient was treated symptomatically and had significant improvement. She was able to tolerate by mouth intake  She is safe for discharge with strict return precautions. Patient is to follow-up with the PCP as needed. Patient was provided with prescription for Zofran.   Diagnosis studies interpreted by me and use to my clinical decision-making.  She was seen in conjunction with Dr. Rhunette Croft.  Final diagnoses:  Non-intractable  vomiting with nausea, vomiting of unspecified type        Drema Pry, MD 09/03/15 1610  Derwood Kaplan, MD 09/03/15 2339

## 2015-09-03 NOTE — ED Notes (Signed)
Pt stable, ambulatory, states understanding of discharge instructions 

## 2015-09-03 NOTE — Discharge Instructions (Signed)

## 2015-09-03 NOTE — ED Notes (Signed)
Pt c/o vomiting x 4 days  

## 2015-10-24 ENCOUNTER — Emergency Department (HOSPITAL_COMMUNITY)
Admission: EM | Admit: 2015-10-24 | Discharge: 2015-10-24 | Disposition: A | Discharge status: Home / Self Care | Payer: Self-pay

## 2015-10-24 NOTE — ED Notes (Signed)
No answer when called 

## 2015-10-24 NOTE — ED Notes (Signed)
Pt called again x 2 with no response.

## 2015-10-24 NOTE — ED Notes (Signed)
Per Yevette Edwardsatjana, Phleb., patient has been called in main ED waiting area x2 with no response

## 2015-10-29 ENCOUNTER — Encounter (HOSPITAL_COMMUNITY): Payer: Self-pay | Admitting: *Deleted

## 2015-10-29 ENCOUNTER — Inpatient Hospital Stay (HOSPITAL_COMMUNITY)
Admission: AD | Admit: 2015-10-29 | Discharge: 2015-10-29 | Disposition: A | Discharge status: Home / Self Care | Payer: Self-pay | Source: Ambulatory Visit | Attending: Obstetrics & Gynecology | Admitting: Obstetrics & Gynecology

## 2015-10-29 DIAGNOSIS — O98811 Other maternal infectious and parasitic diseases complicating pregnancy, first trimester: Secondary | ICD-10-CM | POA: Insufficient documentation

## 2015-10-29 DIAGNOSIS — Z3A01 Less than 8 weeks gestation of pregnancy: Secondary | ICD-10-CM

## 2015-10-29 DIAGNOSIS — A599 Trichomoniasis, unspecified: Secondary | ICD-10-CM | POA: Insufficient documentation

## 2015-10-29 DIAGNOSIS — O210 Mild hyperemesis gravidarum: Secondary | ICD-10-CM | POA: Insufficient documentation

## 2015-10-29 DIAGNOSIS — O99331 Smoking (tobacco) complicating pregnancy, first trimester: Secondary | ICD-10-CM | POA: Insufficient documentation

## 2015-10-29 DIAGNOSIS — R109 Unspecified abdominal pain: Secondary | ICD-10-CM

## 2015-10-29 DIAGNOSIS — F17210 Nicotine dependence, cigarettes, uncomplicated: Secondary | ICD-10-CM | POA: Insufficient documentation

## 2015-10-29 DIAGNOSIS — O9989 Other specified diseases and conditions complicating pregnancy, childbirth and the puerperium: Secondary | ICD-10-CM

## 2015-10-29 DIAGNOSIS — O219 Vomiting of pregnancy, unspecified: Secondary | ICD-10-CM

## 2015-10-29 HISTORY — DX: Other specified health status: Z78.9

## 2015-10-29 LAB — WET PREP, GENITAL
Sperm: NONE SEEN
Trich, Wet Prep: NONE SEEN
Yeast Wet Prep HPF POC: NONE SEEN

## 2015-10-29 LAB — URINALYSIS, ROUTINE W REFLEX MICROSCOPIC
Bilirubin Urine: NEGATIVE
Glucose, UA: NEGATIVE mg/dL
Hgb urine dipstick: NEGATIVE
Ketones, ur: 15 mg/dL — AB
NITRITE: NEGATIVE
PH: 6 (ref 5.0–8.0)
Protein, ur: NEGATIVE mg/dL
Specific Gravity, Urine: 1.02 (ref 1.005–1.030)

## 2015-10-29 LAB — URINE MICROSCOPIC-ADD ON: RBC / HPF: NONE SEEN RBC/hpf (ref 0–5)

## 2015-10-29 LAB — POCT PREGNANCY, URINE: Preg Test, Ur: POSITIVE — AB

## 2015-10-29 MED ORDER — METOCLOPRAMIDE HCL 10 MG PO TABS: 10.0000 mg | ORAL_TABLET | Freq: Three times a day (TID) | ORAL | Status: DC

## 2015-10-29 MED ORDER — PROMETHAZINE HCL 25 MG/ML IJ SOLN
25.0000 mg | Freq: Once | INTRAVENOUS | Status: AC
  Administered 2015-10-29: 25 mg via INTRAVENOUS
  Filled 2015-10-29: qty 1

## 2015-10-29 MED ORDER — METRONIDAZOLE 500 MG PO TABS
2000.0000 mg | ORAL_TABLET | Freq: Once | ORAL | Status: AC
  Administered 2015-10-29: 2000 mg via ORAL
  Filled 2015-10-29: qty 4

## 2015-10-29 MED ORDER — FAMOTIDINE IN NACL 20-0.9 MG/50ML-% IV SOLN
20.0000 mg | Freq: Once | INTRAVENOUS | Status: AC
  Administered 2015-10-29: 20 mg via INTRAVENOUS
  Filled 2015-10-29: qty 50

## 2015-10-29 NOTE — MAU Note (Addendum)
C/o N&V for past 2 weeks; denies being around any one else. Who has been sick; no diarrhea or fever; sexual activity but not on BC;

## 2015-10-29 NOTE — MAU Provider Note (Signed)
History     CSN: [id number]  Arrival date and time: 10/29/15 1349   First Provider Initiated Contact with Patient 10/29/15 1456      Chief Complaint  Patient presents with  . Nausea  . Emesis   HPI    [name] is a 25 y.o. female G3P2002 at 6040w3d presenting with nausea and vomiting that started one week ago. She is vomiting every day; about 2 times per day. She is not taking anything for the nausea and vomiting at this time.    She denies vaginal bleeding She denies abdominal pain    OB History    Gravida Para Term Preterm AB TAB SAB Ectopic Multiple Living   3 2 2       2       Past Medical History  Diagnosis Date  . No pertinent past medical history   . Medical history non-contributory     Past Surgical History  Procedure Laterality Date  . No past surgeries      Family History  Problem Relation Age of Onset  . Asthma Sister   . Diabetes Paternal Aunt     Social History  Substance Use Topics  . Smoking status: Current Every Day Smoker -- 0.25 packs/day for 3 years  . Smokeless tobacco: None  . Alcohol Use: 4.8 oz/week    8 Glasses of wine per week    Allergies: No Known Allergies  Prescriptions prior to admission  Medication Sig Dispense Refill Last Dose  . ibuprofen (ADVIL,MOTRIN) 800 MG tablet Take 1 tablet (800 mg total) by mouth 3 (three) times daily. (Patient not taking: Reported on 10/29/2015) 21 tablet 0    Results for orders placed or performed during the hospital encounter of 10/29/15 (from the past 48 hour(s))  Urinalysis, Routine w reflex microscopic ([facility])     Status: Abnormal   Collection Time: 10/29/15  1:55 PM  Result Value Ref Range   Color, Urine YELLOW YELLOW   APPearance CLEAR CLEAR   Specific Gravity, Urine 1.020 1.005 - 1.030   pH 6.0 5.0 - 8.0   Glucose, UA NEGATIVE NEGATIVE mg/dL   Hgb urine dipstick NEGATIVE NEGATIVE   Bilirubin Urine NEGATIVE NEGATIVE   Ketones, ur 15 (A) NEGATIVE mg/dL   Protein, ur  NEGATIVE NEGATIVE mg/dL   Nitrite NEGATIVE NEGATIVE   Leukocytes, UA SMALL (A) NEGATIVE  Urine microscopic-add on     Status: Abnormal   Collection Time: 10/29/15  1:55 PM  Result Value Ref Range   Squamous Epithelial / LPF 0-5 (A) NONE SEEN   WBC, UA 6-30 0 - 5 WBC/hpf   RBC / HPF NONE SEEN 0 - 5 RBC/hpf   Bacteria, UA RARE (A) NONE SEEN   Urine-Other TRICHOMONAS PRESENT   Pregnancy, urine POC     Status: Abnormal   Collection Time: 10/29/15  2:19 PM  Result Value Ref Range   Preg Test, Ur POSITIVE (A) NEGATIVE    Comment:        THE SENSITIVITY OF THIS METHODOLOGY IS >24 mIU/mL   Wet prep, genital     Status: Abnormal   Collection Time: 10/29/15  5:00 PM  Result Value Ref Range   Yeast Wet Prep HPF POC NONE SEEN NONE SEEN   Trich, Wet Prep NONE SEEN NONE SEEN   Clue Cells Wet Prep HPF POC PRESENT (A) NONE SEEN   WBC, Wet Prep HPF POC FEW (A) NONE SEEN    Comment: FEW BACTERIA SEEN  Sperm NONE SEEN     Review of Systems  Constitutional: Negative for fever and chills.  Gastrointestinal: Positive for nausea and vomiting. Negative for abdominal pain.   Physical Exam   Blood pressure 111/54, pulse 97, temperature 98 F (36.7 C), temperature source Oral, resp. rate 16, last menstrual period 09/21/2015.  Physical Exam  Constitutional: She is oriented to person, place, and time. She appears well-developed and well-nourished.  Non-toxic appearance. She has a sickly appearance. No distress.  Respiratory: Effort normal.  Musculoskeletal: Normal range of motion.  Neurological: She is alert and oriented to person, place, and time.  Skin: Skin is warm. She is not diaphoretic.  Psychiatric: Her behavior is normal.    MAU Course  Procedures  None  MDM  Phenergan 25 mg IV LR bolus X1 Po fluid challenge: pass  Flagyl 2 grams given PO for the treatment of trichomonas   Assessment and Plan   A:  1. Nausea and vomiting during pregnancy   2. Trichomonas infection      P:  Discharge home in stable condition Partner needs treatment.  No intercourse for 7 days following treatment for both.  Return to MAU for emergencies only Start prenatal care ASAP RX: Reglan, Phenergan    Duane Lope, NP 10/29/2015 9:56 AM

## 2015-10-29 NOTE — Discharge Instructions (Signed)
Morning Sickness Morning sickness is when you feel sick to your stomach (nauseous) during pregnancy. This nauseous feeling may or may not come with vomiting. It often occurs in the morning but can be a problem any time of day. Morning sickness is most common during the first trimester, but it may continue throughout pregnancy. While morning sickness is unpleasant, it is usually harmless unless you develop severe and continual vomiting (hyperemesis gravidarum). This condition requires more intense treatment.  CAUSES  The cause of morning sickness is not completely known but seems to be related to normal hormonal changes that occur in pregnancy. RISK FACTORS You are at greater risk if you:  Experienced nausea or vomiting before your pregnancy.  Had morning sickness during a previous pregnancy.  Are pregnant with more than one baby, such as twins. TREATMENT  Do not use any medicines (prescription, over-the-counter, or herbal) for morning sickness without first talking to your health care provider. Your health care provider may prescribe or recommend:  Vitamin B6 supplements.  Anti-nausea medicines.  The herbal medicine ginger. HOME CARE INSTRUCTIONS   Only take over-the-counter or prescription medicines as directed by your health care provider.  Taking multivitamins before getting pregnant can prevent or decrease the severity of morning sickness in most women.  Eat a piece of dry toast or unsalted crackers before getting out of bed in the morning.  Eat five or six small meals a day.  Eat dry and bland foods (rice, baked potato). Foods high in carbohydrates are often helpful.  Do not drink liquids with your meals. Drink liquids between meals.  Avoid greasy, fatty, and spicy foods.  Get someone to cook for you if the smell of any food causes nausea and vomiting.  If you feel nauseous after taking prenatal vitamins, take the vitamins at night or with a snack.  Snack on protein  foods (nuts, yogurt, cheese) between meals if you are hungry.  Eat unsweetened gelatins for desserts.  Wearing an acupressure wristband (worn for sea sickness) may be helpful.  Acupuncture may be helpful.  Do not smoke.  Get a humidifier to keep the air in your house free of odors.  Get plenty of fresh air. SEEK MEDICAL CARE IF:   Your home remedies are not working, and you need medicine.  You feel dizzy or lightheaded.  You are losing weight. SEEK IMMEDIATE MEDICAL CARE IF:   You have persistent and uncontrolled nausea and vomiting.  You pass out (faint). MAKE SURE YOU:  Understand these instructions.  Will watch your condition.  Will get help right away if you are not doing well or get worse.   This information is not intended to replace advice given to you by your health care provider. Make sure you discuss any questions you have with your health care provider.   Document Released: 08/16/2006 Document Revised: 06/30/2013 Document Reviewed: 12/10/2012 Elsevier Interactive Patient Education 2016 [employer] Trichomonas Test The trichomonas test is done to diagnose trichomoniasis, an infection caused by an organism called Trichomonas. Trichomoniasis is a sexually transmitted infection (STI). In women, it causes vaginal infections. In men, it can cause the tube that carries urine (urethra) to become inflamed (urethritis). You may have this test as a part of a routine screening for STIs or if you have symptoms of trichomoniasis. To perform the test, your health care provider will take a sample of discharge. The sample is taken from the vagina or cervix in women and from the urethra in men. A urine  sample can also be used for testing. RESULTS It is your responsibility to obtain your test results. Ask the lab or department performing the test when and how you will get your results. Contact your health care provider to discuss any questions you have about your results.    Meaning of Negative Test Results A negative test means you do not have trichomoniasis. Follow your health care provider's directions about any follow-up testing.  Meaning of Positive Test Results A positive test result means you have an active infection that needs to be treated with antibiotic medicine. All your current sexual partners must also be treated or it is likely you will get reinfected.  If your test is positive, your health care provider will start you on medicine and may advise you to:  Not have sexual intercourse until your infection has cleared up.  Use a latex condom properly every time you have sexual intercourse.  Limit the number of sexual partners you have. The more partners you have, the greater your risk of contracting trichomoniasis or another STI.  Tell all sexual partners about your infection so they can also be treated and to prevent reinfection.   This information is not intended to replace advice given to you by your health care provider. Make sure you discuss any questions you have with your health care provider.   Document Released: 07/28/2004 Document Revised: 07/16/2014 Document Reviewed: 07/07/2013 Elsevier Interactive Patient Education [employer]

## 2015-10-30 ENCOUNTER — Inpatient Hospital Stay (HOSPITAL_COMMUNITY): Payer: Self-pay

## 2015-10-30 ENCOUNTER — Inpatient Hospital Stay (HOSPITAL_COMMUNITY)
Admission: AD | Admit: 2015-10-30 | Discharge: 2015-10-30 | Disposition: A | Discharge status: Home / Self Care | Payer: Self-pay | Source: Ambulatory Visit | Attending: Obstetrics & Gynecology | Admitting: Obstetrics & Gynecology

## 2015-10-30 ENCOUNTER — Encounter (HOSPITAL_COMMUNITY): Payer: Self-pay

## 2015-10-30 ENCOUNTER — Ambulatory Visit (HOSPITAL_COMMUNITY)
Admission: EM | Admit: 2015-10-30 | Discharge: 2015-10-30 | Disposition: A | Discharge status: Nursing Home (Includes ICF) | Payer: Self-pay | Attending: Emergency Medicine | Admitting: Emergency Medicine

## 2015-10-30 ENCOUNTER — Encounter (HOSPITAL_COMMUNITY): Payer: Self-pay | Admitting: *Deleted

## 2015-10-30 DIAGNOSIS — O26899 Other specified pregnancy related conditions, unspecified trimester: Secondary | ICD-10-CM

## 2015-10-30 DIAGNOSIS — O219 Vomiting of pregnancy, unspecified: Secondary | ICD-10-CM

## 2015-10-30 DIAGNOSIS — R109 Unspecified abdominal pain: Secondary | ICD-10-CM

## 2015-10-30 DIAGNOSIS — Z3490 Encounter for supervision of normal pregnancy, unspecified, unspecified trimester: Secondary | ICD-10-CM

## 2015-10-30 DIAGNOSIS — O99331 Smoking (tobacco) complicating pregnancy, first trimester: Secondary | ICD-10-CM | POA: Insufficient documentation

## 2015-10-30 DIAGNOSIS — R1030 Lower abdominal pain, unspecified: Secondary | ICD-10-CM | POA: Insufficient documentation

## 2015-10-30 DIAGNOSIS — O9989 Other specified diseases and conditions complicating pregnancy, childbirth and the puerperium: Secondary | ICD-10-CM

## 2015-10-30 DIAGNOSIS — F17210 Nicotine dependence, cigarettes, uncomplicated: Secondary | ICD-10-CM | POA: Insufficient documentation

## 2015-10-30 DIAGNOSIS — Z3A01 Less than 8 weeks gestation of pregnancy: Secondary | ICD-10-CM | POA: Insufficient documentation

## 2015-10-30 DIAGNOSIS — O210 Mild hyperemesis gravidarum: Secondary | ICD-10-CM | POA: Insufficient documentation

## 2015-10-30 DIAGNOSIS — O3680X0 Pregnancy with inconclusive fetal viability, not applicable or unspecified: Secondary | ICD-10-CM

## 2015-10-30 DIAGNOSIS — O26891 Other specified pregnancy related conditions, first trimester: Secondary | ICD-10-CM | POA: Insufficient documentation

## 2015-10-30 LAB — CBC
HEMATOCRIT: 39.1 % (ref 36.0–46.0)
Hemoglobin: 13.3 g/dL (ref 12.0–15.0)
MCH: 30.8 pg (ref 26.0–34.0)
MCHC: 34 g/dL (ref 30.0–36.0)
MCV: 90.5 fL (ref 78.0–100.0)
PLATELETS: 229 10*3/uL (ref 150–400)
RBC: 4.32 MIL/uL (ref 3.87–5.11)
RDW: 12.6 % (ref 11.5–15.5)
WBC: 6.9 10*3/uL (ref 4.0–10.5)

## 2015-10-30 LAB — POCT URINALYSIS DIP (DEVICE)
GLUCOSE, UA: NEGATIVE mg/dL
Hgb urine dipstick: NEGATIVE
KETONES UR: NEGATIVE mg/dL
Nitrite: NEGATIVE
Protein, ur: NEGATIVE mg/dL
SPECIFIC GRAVITY, URINE: 1.02 (ref 1.005–1.030)
UROBILINOGEN UA: 0.2 mg/dL (ref 0.0–1.0)
pH: 7.5 (ref 5.0–8.0)

## 2015-10-30 LAB — URINALYSIS, ROUTINE W REFLEX MICROSCOPIC
BILIRUBIN URINE: NEGATIVE
Glucose, UA: NEGATIVE mg/dL
Hgb urine dipstick: NEGATIVE
Ketones, ur: 15 mg/dL — AB
NITRITE: NEGATIVE
PH: 7 (ref 5.0–8.0)
Protein, ur: NEGATIVE mg/dL
SPECIFIC GRAVITY, URINE: 1.02 (ref 1.005–1.030)

## 2015-10-30 LAB — URINE MICROSCOPIC-ADD ON
Bacteria, UA: NONE SEEN
RBC / HPF: NONE SEEN RBC/hpf (ref 0–5)

## 2015-10-30 LAB — POCT PREGNANCY, URINE: Preg Test, Ur: POSITIVE — AB

## 2015-10-30 LAB — HCG, QUANTITATIVE, PREGNANCY: hCG, Beta Chain, Quant, S: 181 m[IU]/mL — ABNORMAL HIGH (ref <5)

## 2015-10-30 MED ORDER — PROMETHAZINE HCL 25 MG PO TABS: 25.0000 mg | ORAL_TABLET | Freq: Four times a day (QID) | ORAL | Status: DC | PRN

## 2015-10-30 NOTE — Discharge Instructions (Signed)
You have an [facility] Wednesday at 11 am [facility]. Return sooner if you have severe abdominal pain or vaginal bleeding.

## 2015-10-30 NOTE — MAU Provider Note (Signed)
History     CSN: [id number]  Arrival date and time: 10/30/15 1543   First Provider Initiated Contact with Patient 10/30/15 1626      Chief Complaint  Patient presents with  . Emesis During Pregnancy   HPI Dana Santana 25 y.o. [redacted]w[redacted]d  Comes to MAU with continued vomiting and lower abdominal pain.  Was seen in MAU on 10-29-15 and had IV fluids for vomiting.  Was prescribed Reglan to take but client did not realize she needed to pick up her medication - has not had any medication for vomiting.  Today went to Urgent Care for continued vomiting and lower abdominal pain.  On evaluation there, they recommended she come here today for rule out of ectopic pregnancy due to lower abdominal pain.  Client is very worried.  She has had 2 pregnancies and did not have vomiting in her previous pregnancies so she thought that this vomiting means that something is wrong.  She has just finished drinking a ginger ale and has no nausea at this time.  Did a diet recall with her - had graham crackers this morning and nothing else to eat until 4 hours later - then when she ate a potato bowl, she vomited it all and thought she should be checked.  OB History    Gravida Para Term Preterm AB TAB SAB Ectopic Multiple Living   Past Medical History  Diagnosis Date  . No pertinent past medical history   . Medical history non-contributory     Past Surgical History  Procedure Laterality Date  . No past surgeries      Family History  Problem Relation Age of Onset  . Asthma Sister   . Diabetes Paternal Aunt     Social History  Substance Use Topics  . Smoking status: Current Every Day Smoker -- 0.25 packs/day for 3 years  . Smokeless tobacco: Never Used  . Alcohol Use: 4.8 oz/week    8 Glasses of wine per week    Allergies: No Known Allergies  Prescriptions prior to admission  Medication Sig Dispense Refill Last Dose  . metoCLOPramide (REGLAN) 10 MG tablet Take 1 tablet (10 mg  total) by mouth 4 (four) times daily -  before meals and at bedtime. 90 tablet 1 Unknown at Unknown time    Review of Systems  Constitutional: Negative for fever.  Gastrointestinal: Positive for nausea, vomiting and abdominal pain.  Genitourinary:       No vaginal discharge. No vaginal bleeding. No dysuria.   Physical Exam   Blood pressure 129/79, pulse 86, temperature 98.3 F (36.8 C), temperature source Oral, resp. rate 18, last menstrual period 10/04/2015, unknown if currently breastfeeding.  Physical Exam  Nursing note and vitals reviewed. Constitutional: She is oriented to person, place, and time. She appears well-developed and well-nourished.  HENT:  Head: Normocephalic.  Eyes: EOM are normal.  Neck: Neck supple.  GI: Soft. There is tenderness. There is no rebound.  Musculoskeletal: Normal range of motion.  Neurological: She is alert and oriented to person, place, and time.  Skin: Skin is warm and dry.  Psychiatric: She has a normal mood and affect.    MAU Course  Procedures CLINICAL DATA: 25 year old pregnant female presenting with 3 weeks of cramping. Quantitative beta HCG 181.  EDC by LMP: 06/27/2016, projecting to an expected gestational age of [redacted] weeks 4 days.  EXAM: OBSTETRIC <14 WK [place]  AND TRANSVAGINAL OB US  TECHNIQUE: Both transabdominal and transvaginal ultrasound examinations were performed for complete evaluation of the gestation as well as the maternal uterus, adnexal regions, and pelvic cul-de-sac. Transvaginal technique was performed to assess early pregnancy.  COMPARISON: No prior scans from this gestation.  FINDINGS: The anteverted anteflexed uterus is normal in size and configuration, measuring 8.8 x 4.7 x 4.1 cm. No uterine fibroids or other myometrial abnormality. Bilayer endometrial thickness is 11 mm. No endometrial cavity fluid or focal endometrial mass. No evidence of intrauterine gestational sac.  Left ovary measures 3.0 x  2.1 x 2.6 cm. Right ovary measures 3.4 x 2.7 x 3.5 cm. No ovarian or adnexal masses. Trace simple free fluid in the pelvic cul-de-sac.  IMPRESSION: Non-localization of the pregnancy by ultrasound. No ovarian or adnexal abnormalities. The sonographic differential diagnosis is an early intrauterine gestation too small to visualize, a spontaneous abortion or an occult ectopic gestation. Recommend close clinical follow-up with serial serum beta HCG monitoring and follow-up obstetric scan in 2-3 weeks or earlier as clinically warranted.  Results for orders placed or performed during the hospital encounter of 10/30/15 (from the past 24 hour(s))  Urinalysis, Routine w reflex microscopic ([facility])     Status: Abnormal   Collection Time: 10/30/15  3:49 PM  Result Value Ref Range   Color, Urine YELLOW YELLOW   APPearance CLEAR CLEAR   Specific Gravity, Urine 1.020 1.005 - 1.030   pH 7.0 5.0 - 8.0   Glucose, UA NEGATIVE NEGATIVE mg/dL   Hgb urine dipstick NEGATIVE NEGATIVE   Bilirubin Urine NEGATIVE NEGATIVE   Ketones, ur 15 (A) NEGATIVE mg/dL   Protein, ur NEGATIVE NEGATIVE mg/dL   Nitrite NEGATIVE NEGATIVE   Leukocytes, UA TRACE (A) NEGATIVE  Urine microscopic-add on     Status: Abnormal   Collection Time: 10/30/15  3:49 PM  Result Value Ref Range   Squamous Epithelial / LPF 0-5 (A) NONE SEEN   WBC, UA 0-5 0 - 5 WBC/hpf   RBC / HPF NONE SEEN 0 - 5 RBC/hpf   Bacteria, UA NONE SEEN NONE SEEN   Urine-Other MUCOUS PRESENT   CBC     Status: None   Collection Time: 10/30/15  4:34 PM  Result Value Ref Range   WBC 6.9 4.0 - 10.5 K/uL   RBC 4.32 3.87 - 5.11 MIL/uL   Hemoglobin 13.3 12.0 - 15.0 g/dL   HCT 40.939.1 81.136.0 - 91.446.0 %   MCV 90.5 78.0 - 100.0 fL   MCH 30.8 26.0 - 34.0 pg   MCHC 34.0 30.0 - 36.0 g/dL   RDW 78.212.6 95.611.5 - 21.315.5 %   Platelets 229 150 - 400 K/uL  hCG, quantitative, pregnancy     Status: Abnormal   Collection Time: 10/30/15  4:34 PM  Result Value Ref Range   hCG,  Beta Chain, Quant, S 181 (H) <5 mIU/mL     MDM Spent a long time discussing morning sickness with the client since this is new to her.  Advised eating small amounts every 2 hours and avoiding all oils, high fat foods and fried foods.  Named several foods to avoid and client asked, "What can I eat?"  Named some of the possible healthy choices to try, she said she did not like any of those foods.  Discussed the way to take her medications - how they will not cure the nausea and vomiting, but they will help control the symptoms while there is an active dose  in her body.  Client voices understanding.  Plans to pick up the Reglan prescribed previously and will add Phenergan for her to use if she needs additional medication.  Discussed taking 1/2 of a Phenergan tablet if the side effect of drowsiness is severe.  While the abdominal pain she is having is likely from the vomiting she has been having over the past 3 weeks, will evaluate for possible ectopic pregnancy.  Plans to go to [facility] for this pregnancy.  States she has insurance under her father's policy.  Reviewed with client the options for this pregnancy - early pregnancy and is in the uterus not yet able to be seen, not a healthy pregnancy and miscarriage could be a possibility and pregnancy not in the uterus - ectopic pregnancy.  Informed patient that we do not have the info just yet to determine where the pregnancy is, but that the vomiting she is having may be a sign that the pregnancy is growing well and the normal hormone changes in pregnancy are causing her nausea and vomiting.  Stressed that follow up is important to fully determine if this is a healthy pregnancy in the uterus.  Assessment and Plan  Pregnancy of unknown anatomic location Nausea and vomiting Abdominal pain  Plan Return to the [facility] clinic on Wednesday at 11 am for repeat labs. Pelvic rest. Ectopic precautions  discussed.   Felecia Stanfill 10/30/2015, 4:26 PM

## 2015-10-30 NOTE — MAU Note (Addendum)
Seen in MAU yesterday for N&V; c/o N&V today also- pt states that she didn't know that she had a rx called in yesterday for her nausea; c/o abdominal cramping for past 3 weeks- rates pain @ 7 (does not appear to be in pain); pelvic done on pt yesterday and she was treated for trich; states that urgent care sent her to MAU today for an ultrasound because she does not know how far along she is;

## 2015-10-30 NOTE — ED Notes (Signed)
Patient presents with abdominal pain and vomiting x3 weeks, she went to ED 10/29/2015 and she was prescribed IV fluids. Patient is currently having abdominal pain and unable to keep any food down. No acute distress

## 2015-10-30 NOTE — Discharge Instructions (Signed)
[facility]

## 2015-10-30 NOTE — ED Provider Notes (Signed)
HPI  SUBJECTIVE:  Dana Santana is a 25 y.o. female who presents with 3 weeks of low abdominal pain described as cramping, intermittent, lasting hours, irregular. States that it has not changed over the past week. She states it is not getting any worse, but is not going away. She also reports nausea and vomiting throughout the day, and states that she is unable tolerate any by mouth at all. This is been going on for 2 weeks. She has no urinary complaints, change in urine output. No vaginal bleeding, discharge, genital rash, odor. No real back pain. No syncope. LMP 3/28  Patient [facility] to the [facility],on 4/22 for nausea and vomiting, was found to be pregnant and have Trichomonas in her urine. Wet prep was also positive for BV. GC was sent. She was given Phenergan, bolus of LR, Flagyl 2 g by mouth, which she tolerated.  Patient is approximately 3 weeks and 5 days pregnant estimated LMP. She was not complaining of abdominal pain at that time. Past medical history negative for abdominal surgeries, gonorrhea, chlamydia, herpes, HIV, syphilis, yeast infections, pelvic inflammatory disease, ectopic pregnancies, diabetes, hypertension. She is a G3 P2. She has not yet received any prenatal care. No ultrasound.  Past Medical History  Diagnosis Date  . No pertinent past medical history   . Medical history non-contributory     Past Surgical History  Procedure Laterality Date  . No past surgeries      Family History  Problem Relation Age of Onset  . Asthma Sister   . Diabetes Paternal Aunt     Social History  Substance Use Topics  . Smoking status: Current Every Day Smoker -- 0.25 packs/day for 3 years  . Smokeless tobacco: Never Used  . Alcohol Use: 4.8 oz/week    8 Glasses of wine per week    No current facility-administered medications for this encounter.  Current outpatient prescriptions:  .  metoCLOPramide (REGLAN) 10 MG tablet, Take 1 tablet (10 mg total) by  mouth 4 (four) times daily -  before meals and at bedtime., Disp: 90 tablet, Rfl: 1  No Known Allergies   ROS  As noted in HPI.   Physical Exam  BP 127/90 mmHg  Pulse 95  Temp(Src) 98.3 F (36.8 C) (Oral)  SpO2 100%  LMP 10/04/2015 (Exact Date)  Breastfeeding? Unknown  Constitutional: Well developed, well nourished, no acute distress Eyes: PERRL, EOMI, conjunctiva normal bilaterally HENT: Normocephalic, atraumatic,mucus membranes moist Respiratory: Clear to auscultation bilaterally, no rales, no wheezing, no rhonchi Cardiovascular: Normal rate and rhythm, no murmurs, no gallops, no rubs GI: normal appearance, positive suprapubic, no left lower quadrant, no right lower quadrant tenderness, Soft, nondistended, normal bowel sounds, nontender, no rebound, no guarding Pelvic: Normal external genitalia. Positive white, odorous vaginal discharge. Os closed. No bleeding. No CMT. Positive bilateral adnexal tenderness, uterine tenderness. No CMT. Chaperone present during exam.  Back: no CVAT skin: No rash, skin intact Musculoskeletal: No edema, no tenderness, no deformities Neurologic: Alert & oriented x 3, CN II-XII grossly intact, no motor deficits, sensation grossly intact Psychiatric: Speech and behavior appropriate   ED Course   Medications - No data to display  Orders Placed This Encounter  Procedures  . POCT urinalysis dip (device)    Standing Status: Standing     Number of Occurrences: 1     Standing Expiration Date:   . Pregnancy, urine POC    Standing Status: Standing     Number of Occurrences: 1  Standing Expiration Date:    Results for orders placed or performed during the hospital encounter of 10/30/15 (from the past 24 hour(s))  POCT urinalysis dip (device)     Status: Abnormal   Collection Time: 10/30/15  2:38 PM  Result Value Ref Range   Glucose, UA NEGATIVE NEGATIVE mg/dL   Bilirubin Urine SMALL (A) NEGATIVE   Ketones, ur NEGATIVE NEGATIVE mg/dL    Specific Gravity, Urine 1.020 1.005 - 1.030   Hgb urine dipstick NEGATIVE NEGATIVE   pH 7.5 5.0 - 8.0   Protein, ur NEGATIVE NEGATIVE mg/dL   Urobilinogen, UA 0.2 0.0 - 1.0 mg/dL   Nitrite NEGATIVE NEGATIVE   Leukocytes, UA TRACE (A) NEGATIVE  Pregnancy, urine POC     Status: Abnormal   Collection Time: 10/30/15  2:51 PM  Result Value Ref Range   Preg Test, Ur POSITIVE (A) NEGATIVE   No results found.  ED Clinical Impression  Pregnancy  Abdominal pain during pregnancy  ED Assessment/Plan  Previous records reviewed. As noted in history of present illness. Given that the patient had a wet prep and gonorrhea/chlamydia done yesterday,we'll not repeat this. Discussed labs, MDM, plan and followup with patient . [facility] done to make sure that this is not an ectopic pregnancy. Also in the differential with adnexal tenderness is PID, but patient has been afebrile. She has no peritoneal signs. Vitals are normal, feel that she is stable to go by private vehicle. She appears well-hydrated on today's exam. notified women's. Discussed rationale and importance of going to the women's hospital immediately with patient. She agrees with plan.  *This clinic note was created using Dragon dictation software. Therefore, there may be occasional mistakes despite careful proofreading.  ?  Domenick GongAshley Wanya Bangura, MD 10/30/15 [phone]

## 2015-10-31 LAB — GC/CHLAMYDIA PROBE AMP (~~LOC~~) NOT AT ARMC
CHLAMYDIA, DNA PROBE: NEGATIVE
NEISSERIA GONORRHEA: NEGATIVE

## 2015-11-02 ENCOUNTER — Other Ambulatory Visit: Payer: Self-pay

## 2015-11-03 ENCOUNTER — Telehealth: Payer: Self-pay

## 2015-11-03 NOTE — Telephone Encounter (Signed)
I have spoken to patient and she will come in tomorrow 11/04/2015 at 9:30 for her repeat bhcg.

## 2015-11-04 ENCOUNTER — Other Ambulatory Visit: Payer: Self-pay

## 2015-11-04 DIAGNOSIS — O3680X0 Pregnancy with inconclusive fetal viability, not applicable or unspecified: Secondary | ICD-10-CM

## 2015-11-04 LAB — HCG, QUANTITATIVE, PREGNANCY: HCG, BETA CHAIN, QUANT, S: 1576.2 m[IU]/mL — AB

## 2015-11-07 ENCOUNTER — Encounter (HOSPITAL_COMMUNITY): Payer: Self-pay | Admitting: Medical

## 2015-11-07 ENCOUNTER — Inpatient Hospital Stay (HOSPITAL_COMMUNITY): Payer: Self-pay

## 2015-11-07 ENCOUNTER — Inpatient Hospital Stay (HOSPITAL_COMMUNITY)
Admission: AD | Admit: 2015-11-07 | Discharge: 2015-11-07 | Disposition: A | Discharge status: Home / Self Care | Payer: Self-pay | Source: Ambulatory Visit | Attending: Family Medicine | Admitting: Family Medicine

## 2015-11-07 DIAGNOSIS — F17200 Nicotine dependence, unspecified, uncomplicated: Secondary | ICD-10-CM | POA: Insufficient documentation

## 2015-11-07 DIAGNOSIS — R109 Unspecified abdominal pain: Secondary | ICD-10-CM

## 2015-11-07 DIAGNOSIS — O9989 Other specified diseases and conditions complicating pregnancy, childbirth and the puerperium: Secondary | ICD-10-CM

## 2015-11-07 DIAGNOSIS — Z3A01 Less than 8 weeks gestation of pregnancy: Secondary | ICD-10-CM | POA: Insufficient documentation

## 2015-11-07 DIAGNOSIS — O26899 Other specified pregnancy related conditions, unspecified trimester: Secondary | ICD-10-CM

## 2015-11-07 DIAGNOSIS — O99331 Smoking (tobacco) complicating pregnancy, first trimester: Secondary | ICD-10-CM | POA: Insufficient documentation

## 2015-11-07 DIAGNOSIS — O26851 Spotting complicating pregnancy, first trimester: Secondary | ICD-10-CM | POA: Insufficient documentation

## 2015-11-07 LAB — URINALYSIS, ROUTINE W REFLEX MICROSCOPIC
Bilirubin Urine: NEGATIVE
GLUCOSE, UA: NEGATIVE mg/dL
Hgb urine dipstick: NEGATIVE
KETONES UR: NEGATIVE mg/dL
LEUKOCYTES UA: NEGATIVE
NITRITE: NEGATIVE
PROTEIN: NEGATIVE mg/dL
Specific Gravity, Urine: 1.015 (ref 1.005–1.030)
pH: 6 (ref 5.0–8.0)

## 2015-11-07 NOTE — MAU Provider Note (Signed)
History     CSN: [id number]  Arrival date and time: 11/07/15 2118   First Provider Initiated Contact with Patient 11/07/15 2215      Chief Complaint  Patient presents with  . Vaginal Bleeding  . Abdominal Pain   HPI Ms. Dana Santana is a 25 y.o. G3P2002 at 2573w5d who presents to MAU today with complaint of spotting and cramping since today. The patient was seen in MAU on 4/22 and US showed nothing in the uterus or adnexa. [facility] 11/04/15 and had increase in hCG. She was supposed to be scheduled for US as outpatient, but it has not been scheduled yet. Patient states that she has only noted spotting with wiping. She denies heavy bleeding or clots. She states cramping is mild in the suprapubic region. She has not taken anything for pain. She denies fever.   OB History    Gravida Para Term Preterm AB TAB SAB Ectopic Multiple Living   3 2 2      1 2       Past Medical History  Diagnosis Date  . No pertinent past medical history   . Medical history non-contributory     Past Surgical History  Procedure Laterality Date  . No past surgeries      Family History  Problem Relation Age of Onset  . Asthma Sister   . Diabetes Paternal Aunt     Social History  Substance Use Topics  . Smoking status: Current Every Day Smoker -- 0.25 packs/day for 3 years  . Smokeless tobacco: Never Used  . Alcohol Use: 4.8 oz/week    8 Glasses of wine per week    Allergies: No Known Allergies  No prescriptions prior to admission    Review of Systems  Constitutional: Negative for fever and malaise/fatigue.  Gastrointestinal: Positive for abdominal pain. Negative for nausea, vomiting, diarrhea and constipation.  Genitourinary:       + spotting   Physical Exam   Blood pressure 137/94, pulse 70, temperature 98.6 F (37 C), temperature source Oral, resp. rate 18, height 5\' 1"  (1.549 m), weight 156 lb (70.761 kg), last menstrual period 10/04/2015, SpO2 98 %,  unknown if currently breastfeeding.  Physical Exam  Nursing note and vitals reviewed. Constitutional: She is oriented to person, place, and time. She appears well-developed and well-nourished. No distress.  HENT:  Head: Normocephalic and atraumatic.  Cardiovascular: Normal rate.   Respiratory: Effort normal.  GI: Soft. She exhibits no distension. There is no guarding.  Neurological: She is alert and oriented to person, place, and time.  Skin: Skin is warm and dry. No erythema.  Psychiatric: She has a normal mood and affect.    Results for orders placed or performed during the hospital encounter of 11/07/15 (from the past 24 hour(s))  Urinalysis, Routine w reflex microscopic (not at [facility])     Status: None   Collection Time: 11/07/15  9:35 PM  Result Value Ref Range   Color, Urine YELLOW YELLOW   APPearance CLEAR CLEAR   Specific Gravity, Urine 1.015 1.005 - 1.030   pH 6.0 5.0 - 8.0   Glucose, UA NEGATIVE NEGATIVE mg/dL   Hgb urine dipstick NEGATIVE NEGATIVE   Bilirubin Urine NEGATIVE NEGATIVE   Ketones, ur NEGATIVE NEGATIVE mg/dL   Protein, ur NEGATIVE NEGATIVE mg/dL   Nitrite NEGATIVE NEGATIVE   Leukocytes, UA NEGATIVE NEGATIVE    [place] Ob Transvaginal  11/07/2015  CLINICAL DATA:  Vaginal bleeding  this morning. Cramping on off for several days. Estimated gestational age by LMP is 4 weeks 6 days. Quantitative beta HCG is 1576 on 11/04/15 and was rising. EXAM: TRANSVAGINAL OB ULTRASOUND TECHNIQUE: Transvaginal ultrasound was performed for complete evaluation of the gestation as well as the maternal uterus, adnexal regions, and pelvic cul-de-sac. COMPARISON:  10/30/2015 FINDINGS: Intrauterine gestational sac: A single intrauterine gestational sac is visualized. Yolk sac:  Yolk sac is present. Embryo:  Fetal pole is not identified. Cardiac Activity: Not identified. MSD: 6.8  mm   5 w   3  d Subchorionic hemorrhage:  None visualized. Maternal uterus/adnexae: The uterus is mildly anteverted.  No myometrial mass lesions identified. Both ovaries are visualized and appear normal. Small corpus luteal cysts is suggested on the right ovary. No free fluid. IMPRESSION: Single intrauterine gestational sac with yolk sac. Estimated gestational age by mean sac diameter is 5 weeks 3 days. Fetal pole is not identified, likely due to early pregnancy. Follow-up as clinically indicated. Electronically Signed   By: Burman Nieves M.D.   On: 11/07/2015 22:11    MAU Course  Procedures None  MDM Reviewed recent visits in [facility]U and [facility] including labs and [place] Last [place] on 4/22 showed no IUGS HCG had risen appropriately in [facility] on 4/28 Ordered transvaginal US today to re-eval for confirmation of IUP due to bleeding  Patient had recent infection testing performed on 4/22 and has been treated with Flagyl.   Assessment and Plan  A: IUGS and YS at [redacted]w[redacted]d Spotting in pregnancy, first trimester  P: Discharge home Bleeding precautions discussed Patient advised to follow-up with OB provider of choice to start prenatal care. Plans to return to [facility] Patient may return to MAU as needed or if her condition were to change or worsen   Marny Lowenstein, PA-C  11/08/2015, 1:18 AM

## 2015-11-07 NOTE — Discharge Instructions (Signed)

## 2015-11-07 NOTE — MAU Note (Signed)
Pt reports she had a pinkish discharge this pm. Also reports cramping off/on several days.

## 2015-11-17 ENCOUNTER — Inpatient Hospital Stay (HOSPITAL_COMMUNITY)
Admission: AD | Admit: 2015-11-17 | Discharge: 2015-11-17 | Disposition: A | Discharge status: Home / Self Care | Payer: Self-pay | Source: Ambulatory Visit | Attending: Obstetrics and Gynecology | Admitting: Obstetrics and Gynecology

## 2015-11-17 ENCOUNTER — Inpatient Hospital Stay (HOSPITAL_COMMUNITY): Payer: Self-pay

## 2015-11-17 ENCOUNTER — Encounter (HOSPITAL_COMMUNITY): Payer: Self-pay | Admitting: *Deleted

## 2015-11-17 DIAGNOSIS — Z87891 Personal history of nicotine dependence: Secondary | ICD-10-CM | POA: Insufficient documentation

## 2015-11-17 DIAGNOSIS — R102 Pelvic and perineal pain: Secondary | ICD-10-CM | POA: Insufficient documentation

## 2015-11-17 DIAGNOSIS — Z3A01 Less than 8 weeks gestation of pregnancy: Secondary | ICD-10-CM | POA: Insufficient documentation

## 2015-11-17 DIAGNOSIS — O26891 Other specified pregnancy related conditions, first trimester: Secondary | ICD-10-CM | POA: Insufficient documentation

## 2015-11-17 DIAGNOSIS — O3680X0 Pregnancy with inconclusive fetal viability, not applicable or unspecified: Secondary | ICD-10-CM

## 2015-11-17 DIAGNOSIS — O3680X1 Pregnancy with inconclusive fetal viability, fetus 1: Secondary | ICD-10-CM

## 2015-11-17 LAB — URINALYSIS, ROUTINE W REFLEX MICROSCOPIC
BILIRUBIN URINE: NEGATIVE
GLUCOSE, UA: NEGATIVE mg/dL
HGB URINE DIPSTICK: NEGATIVE
Ketones, ur: NEGATIVE mg/dL
Leukocytes, UA: NEGATIVE
Nitrite: NEGATIVE
PROTEIN: NEGATIVE mg/dL
Specific Gravity, Urine: 1.015 (ref 1.005–1.030)
pH: 6.5 (ref 5.0–8.0)

## 2015-11-17 LAB — HCG, QUANTITATIVE, PREGNANCY: HCG, BETA CHAIN, QUANT, S: 55395 m[IU]/mL — AB (ref <5)

## 2015-11-17 NOTE — Discharge Instructions (Signed)

## 2015-11-17 NOTE — MAU Note (Signed)
Pt c/o abd cramping x 3 weeks. Pt denies and vag discharge or bleeding.

## 2015-11-17 NOTE — MAU Provider Note (Signed)
Chief Complaint: Abdominal Cramping   None     SUBJECTIVE HPI: Dana Santana is a 25 y.o. G3P2002 at [redacted]w[redacted]d by LMP who presents to maternity admissions reporting abdominal cramping/pelvic pain x 3 weeks with significant worsening today.  She reports she had mild cramping/spotting when she was seen in MAU on 5/1 but her bleeding stopped. Today, the pain was so severe she thought she was having a miscarriage but she has seen no bleeding.  The pain is slightly improved now from this morning after resting and drinking fluids. She has not tried any medications.   She denies vaginal itching/burning, urinary symptoms, h/a, dizziness, n/v, or fever/chills.     HPI  Past Medical History  Diagnosis Date  . No pertinent past medical history   . Medical history non-contributory    Past Surgical History  Procedure Laterality Date  . No past surgeries     Social History   Social History  . Marital Status: Single    Spouse Name: N/A  . Number of Children: N/A  . Years of Education: N/A   Occupational History  . Not on file.   Social History Main Topics  . Smoking status: Former Smoker -- 0.25 packs/day for 3 years    Quit date: 10/08/2015  . Smokeless tobacco: Never Used  . Alcohol Use: 4.8 oz/week    8 Glasses of wine per week  . Drug Use: No  . Sexual Activity: Yes   Other Topics Concern  . Not on file   Social History Narrative   No current facility-administered medications on file prior to encounter.   Current Outpatient Prescriptions on File Prior to Encounter  Medication Sig Dispense Refill  . metoCLOPramide (REGLAN) 10 MG tablet Take 1 tablet (10 mg total) by mouth 4 (four) times daily -  before meals and at bedtime. 90 tablet 1  . promethazine (PHENERGAN) 25 MG tablet Take 1 tablet (25 mg total) by mouth every 6 (six) hours as needed for nausea or vomiting. (Patient not taking: Reported on 11/17/2015) 20 tablet 0   No Known Allergies  ROS:  Review of Systems   Constitutional: Negative for fever, chills and fatigue.  Respiratory: Negative for shortness of breath.   Cardiovascular: Negative for chest pain.  Gastrointestinal: Positive for abdominal pain.  Genitourinary: Positive for pelvic pain. Negative for dysuria, flank pain, vaginal bleeding, vaginal discharge, difficulty urinating and vaginal pain.  Neurological: Negative for dizziness and headaches.  Psychiatric/Behavioral: Negative.      I have reviewed patient's Past Medical Hx, Surgical Hx, Family Hx, Social Hx, medications and allergies.   Physical Exam   Patient Vitals for the past 24 hrs:  BP Temp Temp src Pulse Resp Height Weight  11/17/15 1043 130/66 mmHg 98.3 F (36.8 C) Oral 87 18  (1.549 m) 156 lb (70.761 kg)   Constitutional: Well-developed, well-nourished female in no acute distress.  Cardiovascular: normal rate Respiratory: normal effort GI: Abd soft, non-tender. Pos BS x 4 MS: Extremities nontender, no edema, normal ROM Neurologic: Alert and oriented x 4.  GU: Neg CVAT.  PELVIC EXAM: Deferred   LAB RESULTS Results for orders placed or performed during the hospital encounter of 11/17/15 (from the past 24 hour(s))  Urinalysis, Routine w reflex microscopic (not at [facility])     Status: None   Collection Time: 11/17/15 11:10 AM  Result Value Ref Range   Color, Urine YELLOW YELLOW   APPearance CLEAR CLEAR   Specific Gravity, Urine 1.015 1.005 -  1.030   pH 6.5 5.0 - 8.0   Glucose, UA NEGATIVE NEGATIVE mg/dL   Hgb urine dipstick NEGATIVE NEGATIVE   Bilirubin Urine NEGATIVE NEGATIVE   Ketones, ur NEGATIVE NEGATIVE mg/dL   Protein, ur NEGATIVE NEGATIVE mg/dL   Nitrite NEGATIVE NEGATIVE   Leukocytes, UA NEGATIVE NEGATIVE  hCG, quantitative, pregnancy     Status: Abnormal   Collection Time: 11/17/15  2:20 PM  Result Value Ref Range   hCG, Beta Chain, Quant, S 55395 (H) <5 mIU/mL       IMAGING  [place] Ob Transvaginal  11/17/2015  CLINICAL DATA:  Pregnancy with  inconclusive viability, pain x2 weeks EXAM: TRANSVAGINAL OB ULTRASOUND TECHNIQUE: Transvaginal ultrasound was performed for complete evaluation of the gestation as well as the maternal uterus, adnexal regions, and pelvic cul-de-sac. COMPARISON:  11/07/2015 FINDINGS: Intrauterine gestational sac: Single Yolk sac:  Suspected Embryo:  Suspected Cardiac Activity: Not visualized CRL:   3.5  mm   6 w 0 d                  US EDC: 07/13/2015 Subchorionic hemorrhage:  None visualized. Maternal uterus/adnexae: Bilateral ovaries are within normal limits. No free fluid. IMPRESSION: Single intrauterine gestational sac with suspected yolk sac and early fetal pole, measuring 6 weeks 0 days by mean sac diameter. No cardiac activity is visualized, although this may be related to the small size of the suspected fetal pole. Correlate with beta HCG; if not appropriately elevated relative to the most recent prior value(s), this appearance would be considered suspicious for failed pregnancy. Serial beta HCG is suggested. Consider follow-up pelvic ultrasound in 10-14 days as clinically warranted. Electronically Signed   By: Charline BillsSriyesh  Krishnan M.D.   On: 11/17/2015 13:35   [place] Ob Transvaginal  11/07/2015  CLINICAL DATA:  Vaginal bleeding this morning. Cramping on off for several days. Estimated gestational age by LMP is 4 weeks 6 days. Quantitative beta HCG is 1576 on 11/04/15 and was rising. EXAM: TRANSVAGINAL OB ULTRASOUND TECHNIQUE: Transvaginal ultrasound was performed for complete evaluation of the gestation as well as the maternal uterus, adnexal regions, and pelvic cul-de-sac. COMPARISON:  10/30/2015 FINDINGS: Intrauterine gestational sac: A single intrauterine gestational sac is visualized. Yolk sac:  Yolk sac is present. Embryo:  Fetal pole is not identified. Cardiac Activity: Not identified. MSD: 6.8  mm   5 w   3  d Subchorionic hemorrhage:  None visualized. Maternal uterus/adnexae: The uterus is mildly anteverted. No myometrial  mass lesions identified. Both ovaries are visualized and appear normal. Small corpus luteal cysts is suggested on the right ovary. No free fluid. IMPRESSION: Single intrauterine gestational sac with yolk sac. Estimated gestational age by mean sac diameter is 5 weeks 3 days. Fetal pole is not identified, likely due to early pregnancy. Follow-up as clinically indicated. Electronically Signed   By: Burman NievesWilliam  Stevens M.D.   On: 11/07/2015 22:11   [place] Ob Transvaginal  10/30/2015  CLINICAL DATA:  25 year old pregnant female presenting with 3 weeks of cramping. Quantitative beta HCG 181. EDC by LMP: 06/27/2016, projecting to an expected gestational age of [redacted] weeks 4 days. EXAM: OBSTETRIC <14 WK US AND TRANSVAGINAL OB US TECHNIQUE: Both transabdominal and transvaginal ultrasound examinations were performed for complete evaluation of the gestation as well as the maternal uterus, adnexal regions, and pelvic cul-de-sac. Transvaginal technique was performed to assess early pregnancy. COMPARISON:  No prior scans from this gestation. FINDINGS: The anteverted anteflexed uterus is normal in size and configuration, measuring 8.8  x 4.7 x 4.1 cm. No uterine fibroids or other myometrial abnormality. Bilayer endometrial thickness is 11 mm. No endometrial cavity fluid or focal endometrial mass. No evidence of intrauterine gestational sac. Left ovary measures 3.0 x 2.1 x 2.6 cm. Right ovary measures 3.4 x 2.7 x 3.5 cm. No ovarian or adnexal masses. Trace simple free fluid in the pelvic cul-de-sac. IMPRESSION: Non-localization of the pregnancy by ultrasound. No ovarian or adnexal abnormalities. The sonographic differential diagnosis is an early intrauterine gestation too small to visualize, a spontaneous abortion or an occult ectopic gestation. Recommend close clinical follow-up with serial serum beta HCG monitoring and follow-up obstetric scan in 2-3 weeks or earlier as clinically warranted. Electronically Signed   By: Delbert Phenix M.D.    On: 10/30/2015 17:51    MAU Management/MDM: Ordered US and labs and reviewed results.  Consult Dr Jolayne Panther. With significant rise in hcg but little growth on [place], likely failed pregnancy but not definitive.  Plan to repeat quant hcg in 48 hours.  Precautions given/reasons to return to MAU.  Pt stable at time of discharge.  ASSESSMENT 1. Pelvic pain affecting pregnancy in first trimester, antepartum     PLAN Discharge home   Medication List    STOP taking these medications        promethazine 25 MG tablet  Commonly known as:  PHENERGAN      TAKE these medications        metoCLOPramide 10 MG tablet  Commonly known as:  REGLAN  Take 1 tablet (10 mg total) by mouth 4 (four) times daily -  before meals and at bedtime.       Follow-up Information    Follow up with [facility]  [facility] AD[facility].   Why:  On Saturday for repeat labs or sooner for emergencies   Contact information:   [address] [id number] mc [place] [address] [phone]      Sharen Counter Certified Nurse-Midwife 11/17/2015  4:19 PM

## 2015-11-19 ENCOUNTER — Inpatient Hospital Stay (HOSPITAL_COMMUNITY)
Admission: AD | Admit: 2015-11-19 | Discharge: 2015-11-19 | Disposition: A | Discharge status: Home / Self Care | Payer: Self-pay | Source: Ambulatory Visit | Attending: Obstetrics & Gynecology | Admitting: Obstetrics & Gynecology

## 2015-11-19 DIAGNOSIS — Z87891 Personal history of nicotine dependence: Secondary | ICD-10-CM | POA: Insufficient documentation

## 2015-11-19 DIAGNOSIS — Z833 Family history of diabetes mellitus: Secondary | ICD-10-CM | POA: Insufficient documentation

## 2015-11-19 DIAGNOSIS — Z3A01 Less than 8 weeks gestation of pregnancy: Secondary | ICD-10-CM | POA: Insufficient documentation

## 2015-11-19 DIAGNOSIS — R102 Pelvic and perineal pain: Secondary | ICD-10-CM | POA: Insufficient documentation

## 2015-11-19 DIAGNOSIS — Z825 Family history of asthma and other chronic lower respiratory diseases: Secondary | ICD-10-CM | POA: Insufficient documentation

## 2015-11-19 DIAGNOSIS — IMO0001 Reserved for inherently not codable concepts without codable children: Secondary | ICD-10-CM

## 2015-11-19 DIAGNOSIS — O26891 Other specified pregnancy related conditions, first trimester: Secondary | ICD-10-CM | POA: Insufficient documentation

## 2015-11-19 DIAGNOSIS — R030 Elevated blood-pressure reading, without diagnosis of hypertension: Secondary | ICD-10-CM | POA: Insufficient documentation

## 2015-11-19 DIAGNOSIS — O3680X0 Pregnancy with inconclusive fetal viability, not applicable or unspecified: Secondary | ICD-10-CM | POA: Insufficient documentation

## 2015-11-19 LAB — HCG, QUANTITATIVE, PREGNANCY: HCG, BETA CHAIN, QUANT, S: 73365 m[IU]/mL — AB (ref <5)

## 2015-11-19 NOTE — MAU Provider Note (Deleted)
History     CSN: [id number]  Arrival date and time: 11/17/15 1034   None     Chief Complaint  Patient presents with  . Abdominal Cramping   HPI    Past Medical History  Diagnosis Date  . No pertinent past medical history   . Medical history non-contributory     Past Surgical History  Procedure Laterality Date  . No past surgeries      Family History  Problem Relation Age of Onset  . Asthma Sister   . Diabetes Paternal Aunt     Social History  Substance Use Topics  . Smoking status: Former Smoker -- 0.25 packs/day for 3 years    Quit date: 10/08/2015  . Smokeless tobacco: Never Used  . Alcohol Use: 4.8 oz/week    8 Glasses of wine per week    Allergies: No Known Allergies  Prescriptions prior to admission  Medication Sig Dispense Refill Last Dose  . metoCLOPramide (REGLAN) 10 MG tablet Take 1 tablet (10 mg total) by mouth 4 (four) times daily -  before meals and at bedtime. 90 tablet 1 Unknown at Unknown time    ROS Physical Exam   Blood pressure 132/77, pulse 75, temperature 98.8 F (37.1 C), temperature source Oral, resp. rate 18, height  (1.549 m), weight 70.761 kg (156 lb), last menstrual period 10/04/2015, unknown if currently breastfeeding.  Physical Exam  MAU Course  Procedures IMAGING  US Ob Transvaginal  11/17/2015 CLINICAL DATA: Pregnancy with inconclusive viability, pain x2 weeks EXAM: TRANSVAGINAL OB ULTRASOUND TECHNIQUE: Transvaginal ultrasound was performed for complete evaluation of the gestation as well as the maternal uterus, adnexal regions, and pelvic cul-de-sac. COMPARISON: 11/07/2015 FINDINGS: Intrauterine gestational sac: Single Yolk sac: Suspected Embryo: Suspected Cardiac Activity: Not visualized CRL: 3.5 mm 6 w 0 d  [place] EDC: 07/13/2015 Subchorionic hemorrhage: None visualized. Maternal uterus/adnexae: Bilateral ovaries are within normal limits. No free fluid. IMPRESSION: Single intrauterine  gestational sac with suspected yolk sac and early fetal pole, measuring 6 weeks 0 days by mean sac diameter. No cardiac activity is visualized, although this may be related to the small size of the suspected fetal pole. Correlate with beta HCG; if not appropriately elevated relative to the most recent prior value(s), this appearance would be considered suspicious for failed pregnancy. Serial beta HCG is suggested. Consider follow-up pelvic ultrasound in 10-14 days as clinically warranted. Electronically Signed By: Charline Bills M.D. On: 11/17/2015 13:35   US Ob Transvaginal  11/07/2015 CLINICAL DATA: Vaginal bleeding this morning. Cramping on off for several days. Estimated gestational age by LMP is 4 weeks 6 days. Quantitative beta HCG is 1576 on 11/04/15 and was rising. EXAM: TRANSVAGINAL OB ULTRASOUND TECHNIQUE: Transvaginal ultrasound was performed for complete evaluation of the gestation as well as the maternal uterus, adnexal regions, and pelvic cul-de-sac. COMPARISON: 10/30/2015 FINDINGS: Intrauterine gestational sac: A single intrauterine gestational sac is visualized. Yolk sac: Yolk sac is present. Embryo: Fetal pole is not identified. Cardiac Activity: Not identified. MSD: 6.8 mm 5 w 3 d Subchorionic hemorrhage: None visualized. Maternal uterus/adnexae: The uterus is mildly anteverted. No myometrial mass lesions identified. Both ovaries are visualized and appear normal. Small corpus luteal cysts is suggested on the right ovary. No free fluid. IMPRESSION: Single intrauterine gestational sac with yolk sac. Estimated gestational age by mean sac diameter is 5 weeks 3 days. Fetal pole is not identified, likely due to early pregnancy. Follow-up as clinically indicated. Electronically Signed By: Burman Nieves M.D. On: 11/07/2015 22:11  [place] Ob Transvaginal  10/30/2015 CLINICAL DATA: 25 year old pregnant female presenting with 3 weeks of cramping. Quantitative beta HCG 181. EDC by  LMP: 06/27/2016, projecting to an expected gestational age of [redacted] weeks 4 days. EXAM: OBSTETRIC <14 WK US AND TRANSVAGINAL OB US TECHNIQUE: Both transabdominal and transvaginal ultrasound examinations were performed for complete evaluation of the gestation as well as the maternal uterus, adnexal regions, and pelvic cul-de-sac. Transvaginal technique was performed to assess early pregnancy. COMPARISON: No prior scans from this gestation. FINDINGS: The anteverted anteflexed uterus is normal in size and configuration, measuring 8.8 x 4.7 x 4.1 cm. No uterine fibroids or other myometrial abnormality. Bilayer endometrial thickness is 11 mm. No endometrial cavity fluid or focal endometrial mass. No evidence of intrauterine gestational sac. Left ovary measures 3.0 x 2.1 x 2.6 cm. Right ovary measures 3.4 x 2.7 x 3.5 cm. No ovarian or adnexal masses. Trace simple free fluid in the pelvic cul-de-sac. IMPRESSION: Non-localization of the pregnancy by ultrasound. No ovarian or adnexal abnormalities. The sonographic differential diagnosis is an early intrauterine gestation too small to visualize, a spontaneous abortion or an occult ectopic gestation. Recommend close clinical follow-up with serial serum beta HCG monitoring and follow-up obstetric scan in 2-3 weeks or earlier as clinically warranted. Electronically Signed By: Delbert PhenixJason A Poff M.D. On: 10/30/2015 17:51     Ref Range 2:22 PM (11/19/15) 2d ago (11/17/15) 2wk ago (11/04/15) 2wk ago (10/30/15)    hCG, Beta Chain, Quant, S <5 mIU/mL 73365 (H) 55395 (H)CM 1576.2 (H)R, CM 181 (H)CM         Assessment and Plan   1. Pelvic pain affecting pregnancy in first trimester, antepartum   2. Pregnancy with inconclusive fetal viability, fetus 1   3. Encounter to determine fetal viability of pregnancy     Dana Santana 11/19/2015, 3:39 PM

## 2015-11-19 NOTE — Discharge Instructions (Signed)

## 2015-11-19 NOTE — MAU Provider Note (Signed)
History     CSN: [id number]  Arrival date and time: 11/19/15 1410     SUBJECTIVE:   Chief Complaint  Patient presents with  . Follow-up   HPI Dana Santana is a 25 y.o. G3P2002 at [redacted]w[redacted]d presenting for follow-up quantitative beta hCG. She continues to have mild intermittent lower abdominal cramping but has had no vaginal bleeding. OB History  Gravida Para Term Preterm AB SAB TAB Ectopic Multiple Living  # Outcome Date GA Lbr Len/2nd Weight Sex Delivery Anes PTL Lv  3A Gravida           3B Current           2 Term 2011    M Vag-Spont   Y  1 Term 2007    M Vag-Spont   Y       Past Medical History  Diagnosis Date  . No pertinent past medical history   . Medical history non-contributory     Past Surgical History  Procedure Laterality Date  . No past surgeries      Family History  Problem Relation Age of Onset  . Asthma Sister   . Diabetes Paternal Aunt     Social History  Substance Use Topics  . Smoking status: Former Smoker -- 0.25 packs/day for 3 years    Quit date: 10/08/2015  . Smokeless tobacco: Never Used  . Alcohol Use: 4.8 oz/week    8 Glasses of wine per week    Allergies: No Known Allergies  Prescriptions prior to admission  Medication Sig Dispense Refill Last Dose  . metoCLOPramide (REGLAN) 10 MG tablet Take 1 tablet (10 mg total) by mouth 4 (four) times daily -  before meals and at bedtime. 90 tablet 1 Unknown at Unknown time    ROS   OBJECTIVE:  Physical Exam   Blood pressure 146/72, pulse 94, temperature 98.6 F (37 C), temperature source Oral, resp. rate 18, last menstrual period 10/04/2015, unknown if currently breastfeeding.  Physical Exam  MAU Course  Procedures IMAGING  US Ob Transvaginal  11/17/2015 CLINICAL DATA: Pregnancy with inconclusive viability, pain x2 weeks EXAM: TRANSVAGINAL OB ULTRASOUND TECHNIQUE: Transvaginal ultrasound was performed for complete evaluation of the gestation as well as the  maternal uterus, adnexal regions, and pelvic cul-de-sac. COMPARISON: 11/07/2015 FINDINGS: Intrauterine gestational sac: Single Yolk sac: Suspected Embryo: Suspected Cardiac Activity: Not visualized CRL: 3.5 mm 6 w 0 d  [place] EDC: 07/13/2015 Subchorionic hemorrhage: None visualized. Maternal uterus/adnexae: Bilateral ovaries are within normal limits. No free fluid. IMPRESSION: Single intrauterine gestational sac with suspected yolk sac and early fetal pole, measuring 6 weeks 0 days by mean sac diameter. No cardiac activity is visualized, although this may be related to the small size of the suspected fetal pole. Correlate with beta HCG; if not appropriately elevated relative to the most recent prior value(s), this appearance would be considered suspicious for failed pregnancy. Serial beta HCG is suggested. Consider follow-up pelvic ultrasound in 10-14 days as clinically warranted. Electronically Signed By: Charline Bills M.D. On: 11/17/2015 13:35   US Ob Transvaginal  11/07/2015 CLINICAL DATA: Vaginal bleeding this morning. Cramping on off for several days. Estimated gestational age by LMP is 4 weeks 6 days. Quantitative beta HCG is 1576 on 11/04/15 and was rising. EXAM: TRANSVAGINAL OB ULTRASOUND TECHNIQUE: Transvaginal ultrasound was performed for complete evaluation of the gestation as well as the maternal uterus, adnexal regions, and pelvic cul-de-sac. COMPARISON:  10/30/2015 FINDINGS: Intrauterine gestational sac: A single intrauterine gestational sac is visualized. Yolk sac: Yolk sac is present. Embryo: Fetal pole is not identified. Cardiac Activity: Not identified. MSD: 6.8 mm 5 w 3 d Subchorionic hemorrhage: None visualized. Maternal uterus/adnexae: The uterus is mildly anteverted. No myometrial mass lesions identified. Both ovaries are visualized and appear normal. Small corpus luteal cysts is suggested on the right ovary. No free fluid. IMPRESSION: Single  intrauterine gestational sac with yolk sac. Estimated gestational age by mean sac diameter is 5 weeks 3 days. Fetal pole is not identified, likely due to early pregnancy. Follow-up as clinically indicated. Electronically Signed By: Burman NievesWilliam Stevens M.D. On: 11/07/2015 22:11   [place] Ob Transvaginal  10/30/2015 CLINICAL DATA: 25 year old pregnant female presenting with 3 weeks of cramping. Quantitative beta HCG 181. EDC by LMP: 06/27/2016, projecting to an expected gestational age of [redacted] weeks 4 days. EXAM: OBSTETRIC <14 WK US AND TRANSVAGINAL OB US TECHNIQUE: Both transabdominal and transvaginal ultrasound examinations were performed for complete evaluation of the gestation as well as the maternal uterus, adnexal regions, and pelvic cul-de-sac. Transvaginal technique was performed to assess early pregnancy. COMPARISON: No prior scans from this gestation. FINDINGS: The anteverted anteflexed uterus is normal in size and configuration, measuring 8.8 x 4.7 x 4.1 cm. No uterine fibroids or other myometrial abnormality. Bilayer endometrial thickness is 11 mm. No endometrial cavity fluid or focal endometrial mass. No evidence of intrauterine gestational sac. Left ovary measures 3.0 x 2.1 x 2.6 cm. Right ovary measures 3.4 x 2.7 x 3.5 cm. No ovarian or adnexal masses. Trace simple free fluid in the pelvic cul-de-sac. IMPRESSION: Non-localization of the pregnancy by ultrasound. No ovarian or adnexal abnormalities. The sonographic differential diagnosis is an early intrauterine gestation too small to visualize, a spontaneous abortion or an occult ectopic gestation. Recommend close clinical follow-up with serial serum beta HCG monitoring and follow-up obstetric scan in 2-3 weeks or earlier as clinically warranted. Electronically Signed By: Delbert PhenixJason A Poff M.D. On: 10/30/2015 17:51     Ref Range 2:22 PM (11/19/15) 2d ago (11/17/15) 2wk ago (11/04/15) 2wk ago (10/30/15)    hCG, Beta Chain, Quant, S <5 mIU/mL 73365 (H)  55395 (H)CM 1576.2 (H)R, CM 181 (H)CM        MDM Consulted Dr. Erin FullingHarraway-Smith who reviewed US's and serial quantitative beta hCGs> recommends US in 1 week with visit in [facility] after US. If no viable IUP then, offer cytotec for EPF Counseled pt on likelihood of EPF   Assessment and Plan  Pelvic pain affecting pregnancy in first trimester, antepartum - Plan: Discharge patient  Pregnancy with inconclusive fetal viability, not applicable or unspecified fetus - Plan: Discharge patient, US OB Transvaginal  Elevated BP   Follow-up Information    [facility].   Specialty:  Obstetrics and Gynecology   Why:  Someone from US department will call you with appointment; [facility]   Contact information:   [address] [place] [id number] [phone]      POE,DEIRDRE 11/19/2015, 4:11 PM

## 2015-11-19 NOTE — MAU Note (Signed)
Patient still complains of lower abdominal pain especially when turning has been persistent for several weeks not worsening in severity rates 4/10, denies vaginal bleeding.

## 2015-11-25 ENCOUNTER — Inpatient Hospital Stay (HOSPITAL_COMMUNITY)
Admission: AD | Admit: 2015-11-25 | Discharge: 2015-11-25 | Disposition: A | Discharge status: Home / Self Care | Payer: Self-pay | Source: Ambulatory Visit | Attending: Family Medicine | Admitting: Family Medicine

## 2015-11-25 ENCOUNTER — Inpatient Hospital Stay (HOSPITAL_COMMUNITY): Payer: Self-pay

## 2015-11-25 DIAGNOSIS — Z3490 Encounter for supervision of normal pregnancy, unspecified, unspecified trimester: Secondary | ICD-10-CM

## 2015-11-25 DIAGNOSIS — Z6791 Unspecified blood type, Rh negative: Secondary | ICD-10-CM | POA: Insufficient documentation

## 2015-11-25 DIAGNOSIS — O021 Missed abortion: Secondary | ICD-10-CM

## 2015-11-25 DIAGNOSIS — R109 Unspecified abdominal pain: Secondary | ICD-10-CM | POA: Insufficient documentation

## 2015-11-25 DIAGNOSIS — O26899 Other specified pregnancy related conditions, unspecified trimester: Secondary | ICD-10-CM

## 2015-11-25 DIAGNOSIS — R102 Pelvic and perineal pain: Secondary | ICD-10-CM

## 2015-11-25 DIAGNOSIS — Z87891 Personal history of nicotine dependence: Secondary | ICD-10-CM | POA: Insufficient documentation

## 2015-11-25 DIAGNOSIS — O26891 Other specified pregnancy related conditions, first trimester: Secondary | ICD-10-CM | POA: Insufficient documentation

## 2015-11-25 DIAGNOSIS — Z3A08 8 weeks gestation of pregnancy: Secondary | ICD-10-CM | POA: Insufficient documentation

## 2015-11-25 LAB — COMPREHENSIVE METABOLIC PANEL
ALBUMIN: 4.1 g/dL (ref 3.5–5.0)
ALT: 14 U/L (ref 14–54)
ANION GAP: 9 (ref 5–15)
AST: 25 U/L (ref 15–41)
Alkaline Phosphatase: 50 U/L (ref 38–126)
BILIRUBIN TOTAL: 0.6 mg/dL (ref 0.3–1.2)
BUN: 9 mg/dL (ref 6–20)
CALCIUM: 9.1 mg/dL (ref 8.9–10.3)
CHLORIDE: 100 mmol/L — AB (ref 101–111)
CO2: 25 mmol/L (ref 22–32)
Creatinine, Ser: 0.66 mg/dL (ref 0.44–1.00)
GFR calc Af Amer: >60 mL/min (ref >60)
GFR calc non Af Amer: >60 mL/min (ref >60)
GLUCOSE: 129 mg/dL — AB (ref 65–99)
POTASSIUM: 3.7 mmol/L (ref 3.5–5.1)
Sodium: 134 mmol/L — ABNORMAL LOW (ref 135–145)
Total Protein: 6.9 g/dL (ref 6.5–8.1)

## 2015-11-25 LAB — URINALYSIS, ROUTINE W REFLEX MICROSCOPIC
Glucose, UA: NEGATIVE mg/dL
Hgb urine dipstick: NEGATIVE
KETONES UR: 15 mg/dL — AB
LEUKOCYTES UA: NEGATIVE
NITRITE: NEGATIVE
PH: 6 (ref 5.0–8.0)
Protein, ur: 30 mg/dL — AB
SPECIFIC GRAVITY, URINE: 1.025 (ref 1.005–1.030)

## 2015-11-25 LAB — HCG, QUANTITATIVE, PREGNANCY: HCG, BETA CHAIN, QUANT, S: 128047 m[IU]/mL — AB (ref <5)

## 2015-11-25 LAB — TYPE AND SCREEN
ABO/RH(D): B POS
ANTIBODY SCREEN: NEGATIVE

## 2015-11-25 LAB — URINE MICROSCOPIC-ADD ON
BACTERIA UA: NONE SEEN
RBC / HPF: NONE SEEN RBC/hpf (ref 0–5)

## 2015-11-25 NOTE — Discharge Instructions (Signed)
Incomplete Miscarriage A miscarriage is the sudden loss of an unborn baby (fetus) before the 20th week of pregnancy. In an incomplete miscarriage, parts of the fetus or placenta (afterbirth) remain in the body.  Having a miscarriage can be an emotional experience. Talk with your health care provider about any questions you may have about miscarrying, the grieving process, and your future pregnancy plans. CAUSES   Problems with the fetal chromosomes that make it impossible for the baby to develop normally. Problems with the baby's genes or chromosomes are most often the result of errors that occur by chance as the embryo divides and grows. The problems are not inherited from the parents.  Infection of the cervix or uterus.  Hormone problems.  Problems with the cervix, such as having an incompetent cervix. This is when the tissue in the cervix is not strong enough to hold the pregnancy.  Problems with the uterus, such as an abnormally shaped uterus, uterine fibroids, or congenital abnormalities.  Certain medical conditions.  Smoking, drinking alcohol, or taking illegal drugs.  Trauma. SYMPTOMS   Vaginal bleeding or spotting, with or without cramps or pain.  Pain or cramping in the abdomen or lower back.  Passing fluid, tissue, or blood clots from the vagina. DIAGNOSIS  Your health care provider will perform a physical exam. You may also have an ultrasound to confirm the miscarriage. Blood or urine tests may also be ordered. TREATMENT   Usually, a dilation and curettage (D&C) procedure is performed. During a D&C procedure, the cervix is widened (dilated) and any remaining fetal or placental tissue is gently removed from the uterus.  Antibiotic medicines are prescribed if there is an infection. Other medicines may be given to reduce the size of the uterus (contract) if there is a lot of bleeding.  If you have Rh negative blood and your baby was Rh positive, you will need a Rho (D)  immune globulin shot. This shot will protect any future baby from having Rh blood problems in future pregnancies.  You may be confined to bed rest. This means you should stay in bed and only get up to use the bathroom. HOME CARE INSTRUCTIONS   Rest as directed by your health care provider.  Restrict activity as directed by your health care provider. You may be allowed to continue light activity if curettage was not done but you require further treatment.  Keep track of the number of pads you use each day. Keep track of how soaked (saturated) they are. Record this information.  Do not  use tampons.  Do not douche or have sexual intercourse until approved by your health care provider.  Keep all follow-up appointments for reevaluation and continuing management.  Only take over-the-counter or prescription medicines for pain, fever, or discomfort as directed by your health care provider.  Take antibiotic medicine as directed by your health care provider. Make sure you finish it even if you start to feel better. SEEK IMMEDIATE MEDICAL CARE IF:   You experience severe cramps in your stomach, back, or abdomen.  You have an unexplained temperature (make sure to record these temperatures).  You pass large clots or tissue (save these for your health care provider to inspect).  Your bleeding increases.  You become light-headed, weak, or have fainting episodes. MAKE SURE YOU:   Understand these instructions.  Will watch your condition.  Will get help right away if you are not doing well or get worse.   This information is not intended to   replace advice given to you by your health care provider. Make sure you discuss any questions you have with your health care provider.   Document Released: 06/25/2005 Document Revised: 07/16/2014 Document Reviewed: 01/22/2013 Elsevier Interactive Patient Education 2016 [employer]  

## 2015-11-25 NOTE — MAU Note (Signed)
Patient reports cramping all week, worse this morning, denies vaginal bleeding

## 2015-11-25 NOTE — MAU Provider Note (Signed)
MAU HISTORY AND PHYSICAL  Chief Complaint:  Abdominal Cramping   Dana Santana is a 25 y.o.  [id number] with IUP at [redacted]w[redacted]d presenting for Abdominal Cramping  Multiple recent mau visits with pain/cramping. At last visit on 5/13 quant 73k, up from 55k 2 days prior. Today continues to have cramping, happening all week. U/s on 5/13 showed iup but no yolk sac or cardiac activity. No bleeding, no fevers, but occasional nausea present for a few weeks.    Past Medical History  Diagnosis Date  . No pertinent past medical history   . Medical history non-contributory     Past Surgical History  Procedure Laterality Date  . No past surgeries      Family History  Problem Relation Age of Onset  . Asthma Sister   . Diabetes Paternal Aunt     Social History  Substance Use Topics  . Smoking status: Former Smoker -- 0.25 packs/day for 3 years    Quit date: 10/08/2015  . Smokeless tobacco: Never Used  . Alcohol Use: 4.8 oz/week    8 Glasses of wine per week    No Known Allergies  No prescriptions prior to admission    Review of Systems - Negative except for what is mentioned in HPI.  Physical Exam  Blood pressure 132/83, pulse 76, temperature 98.2 F (36.8 C), temperature source Oral, resp. rate 16, last menstrual period 10/04/2015, unknown if currently breastfeeding. GENERAL: Well-developed, well-nourished female in no acute distress.  LUNGS: Clear to auscultation bilaterally.  HEART: Regular rate and rhythm. ABDOMEN: Soft, nontender, nondistended, gravid.  EXTREMITIES: Nontender, no edema, 2+ distal pulses. GU: normal vagina, normal cervix, no bleeding, scant white discharge   Labs: Results for orders placed or performed during the hospital encounter of 11/25/15 (from the past 24 hour(s))  Urinalysis, Routine w reflex microscopic (not at [facility])   Collection Time: 11/25/15  8:00 AM  Result Value Ref Range   Color, Urine YELLOW YELLOW   APPearance CLEAR CLEAR   Specific  Gravity, Urine 1.025 1.005 - 1.030   pH 6.0 5.0 - 8.0   Glucose, UA NEGATIVE NEGATIVE mg/dL   Hgb urine dipstick NEGATIVE NEGATIVE   Bilirubin Urine SMALL (A) NEGATIVE   Ketones, ur 15 (A) NEGATIVE mg/dL   Protein, ur 30 (A) NEGATIVE mg/dL   Nitrite NEGATIVE NEGATIVE   Leukocytes, UA NEGATIVE NEGATIVE  Urine microscopic-add on   Collection Time: 11/25/15  8:00 AM  Result Value Ref Range   Squamous Epithelial / LPF 0-5 (A) NONE SEEN   WBC, UA 0-5 0 - 5 WBC/hpf   RBC / HPF NONE SEEN 0 - 5 RBC/hpf   Bacteria, UA NONE SEEN NONE SEEN   Urine-Other MUCOUS PRESENT   hCG, quantitative, pregnancy   Collection Time: 11/25/15  8:59 AM  Result Value Ref Range   hCG, Beta Chain, Sharene Butters, S 147829 (H) <5 mIU/mL  Comprehensive metabolic panel   Collection Time: 11/25/15  8:59 AM  Result Value Ref Range   Sodium 134 (L) 135 - 145 mmol/L   Potassium 3.7 3.5 - 5.1 mmol/L   Chloride 100 (L) 101 - 111 mmol/L   CO2 25 22 - 32 mmol/L   Glucose, Bld 129 (H) 65 - 99 mg/dL   BUN 9 6 - 20 mg/dL   Creatinine, Ser 5.62 0.44 - 1.00 mg/dL   Calcium 9.1 8.9 - 13.0 mg/dL   Total Protein 6.9 6.5 - 8.1 g/dL   Albumin 4.1 3.5 - 5.0 g/dL  AST 25 15 - 41 U/L   ALT 14 14 - 54 U/L   Alkaline Phosphatase 50 38 - 126 U/L   Total Bilirubin 0.6 0.3 - 1.2 mg/dL   GFR calc non Af Amer >60 >60 mL/min   GFR calc Af Amer >60 >60 mL/min   Anion gap 9 5 - 15  Type and screen   Collection Time: 11/25/15  8:59 AM  Result Value Ref Range   ABO/RH(D) B POS    Antibody Screen NEG    Sample Expiration 11/28/2015     Imaging Studies:  US Ob Comp Less 14 Wks  10/30/2015  CLINICAL DATA:  25 year old pregnant female presenting with 3 weeks of cramping. Quantitative beta HCG 181. EDC by LMP: 06/27/2016, projecting to an expected gestational age of [redacted] weeks 4 days. EXAM: OBSTETRIC <14 WK [place] AND TRANSVAGINAL OB US TECHNIQUE: Both transabdominal and transvaginal ultrasound examinations were performed for complete evaluation of the  gestation as well as the maternal uterus, adnexal regions, and pelvic cul-de-sac. Transvaginal technique was performed to assess early pregnancy. COMPARISON:  No prior scans from this gestation. FINDINGS: The anteverted anteflexed uterus is normal in size and configuration, measuring 8.8 x 4.7 x 4.1 cm. No uterine fibroids or other myometrial abnormality. Bilayer endometrial thickness is 11 mm. No endometrial cavity fluid or focal endometrial mass. No evidence of intrauterine gestational sac. Left ovary measures 3.0 x 2.1 x 2.6 cm. Right ovary measures 3.4 x 2.7 x 3.5 cm. No ovarian or adnexal masses. Trace simple free fluid in the pelvic cul-de-sac. IMPRESSION: Non-localization of the pregnancy by ultrasound. No ovarian or adnexal abnormalities. The sonographic differential diagnosis is an early intrauterine gestation too small to visualize, a spontaneous abortion or an occult ectopic gestation. Recommend close clinical follow-up with serial serum beta HCG monitoring and follow-up obstetric scan in 2-3 weeks or earlier as clinically warranted. Electronically Signed   By: Delbert Phenix M.D.   On: 10/30/2015 17:51   US Ob Transvaginal  11/25/2015  CLINICAL DATA:  Pelvic pain and cramping. Bleeding a few weeks ago, none currently. EXAM: TRANSVAGINAL OB ULTRASOUND TECHNIQUE: Transvaginal ultrasound was performed for complete evaluation of the gestation as well as the maternal uterus, adnexal regions, and pelvic cul-de-sac. COMPARISON:  11/17/2015 FINDINGS: Intrauterine gestational sac: Single Yolk sac:  Not identified. Embryo:  Suspected but only slightly larger than on the prior study. Cardiac Activity: None visualized. CRL:   4.3  mm   6 w 1 d                  [place] EDC: 07/19/2016 Subchorionic hemorrhage:  Small. Maternal uterus/adnexae: Unremarkable ovaries.  No free fluid. IMPRESSION: Single intrauterine gestation with only slight interval enlargement of suspected fetal pole and persistent lack of detectable  cardiac activity. This is suspicious for failed pregnancy. Electronically Signed   By: Sebastian Ache M.D.   On: 11/25/2015 11:58   US Ob Transvaginal  11/17/2015  CLINICAL DATA:  Pregnancy with inconclusive viability, pain x2 weeks EXAM: TRANSVAGINAL OB ULTRASOUND TECHNIQUE: Transvaginal ultrasound was performed for complete evaluation of the gestation as well as the maternal uterus, adnexal regions, and pelvic cul-de-sac. COMPARISON:  11/07/2015 FINDINGS: Intrauterine gestational sac: Single Yolk sac:  Suspected Embryo:  Suspected Cardiac Activity: Not visualized CRL:   3.5  mm   6 w 0 d                  [place] EDC: 07/13/2015 Subchorionic hemorrhage:  None visualized. Maternal  uterus/adnexae: Bilateral ovaries are within normal limits. No free fluid. IMPRESSION: Single intrauterine gestational sac with suspected yolk sac and early fetal pole, measuring 6 weeks 0 days by mean sac diameter. No cardiac activity is visualized, although this may be related to the small size of the suspected fetal pole. Correlate with beta HCG; if not appropriately elevated relative to the most recent prior value(s), this appearance would be considered suspicious for failed pregnancy. Serial beta HCG is suggested. Consider follow-up pelvic ultrasound in 10-14 days as clinically warranted. Electronically Signed   By: Charline BillsSriyesh  Krishnan M.D.   On: 11/17/2015 13:35   [place] Ob Transvaginal  11/07/2015  CLINICAL DATA:  Vaginal bleeding this morning. Cramping on off for several days. Estimated gestational age by LMP is 4 weeks 6 days. Quantitative beta HCG is 1576 on 11/04/15 and was rising. EXAM: TRANSVAGINAL OB ULTRASOUND TECHNIQUE: Transvaginal ultrasound was performed for complete evaluation of the gestation as well as the maternal uterus, adnexal regions, and pelvic cul-de-sac. COMPARISON:  10/30/2015 FINDINGS: Intrauterine gestational sac: A single intrauterine gestational sac is visualized. Yolk sac:  Yolk sac is present. Embryo:  Fetal  pole is not identified. Cardiac Activity: Not identified. MSD: 6.8  mm   5 w   3  d Subchorionic hemorrhage:  None visualized. Maternal uterus/adnexae: The uterus is mildly anteverted. No myometrial mass lesions identified. Both ovaries are visualized and appear normal. Small corpus luteal cysts is suggested on the right ovary. No free fluid. IMPRESSION: Single intrauterine gestational sac with yolk sac. Estimated gestational age by mean sac diameter is 5 weeks 3 days. Fetal pole is not identified, likely due to early pregnancy. Follow-up as clinically indicated. Electronically Signed   By: Burman NievesWilliam  Stevens M.D.   On: 11/07/2015 22:11   [place] Ob Transvaginal  10/30/2015  CLINICAL DATA:  25 year old pregnant female presenting with 3 weeks of cramping. Quantitative beta HCG 181. EDC by LMP: 06/27/2016, projecting to an expected gestational age of [redacted] weeks 4 days. EXAM: OBSTETRIC <14 WK US AND TRANSVAGINAL OB US TECHNIQUE: Both transabdominal and transvaginal ultrasound examinations were performed for complete evaluation of the gestation as well as the maternal uterus, adnexal regions, and pelvic cul-de-sac. Transvaginal technique was performed to assess early pregnancy. COMPARISON:  No prior scans from this gestation. FINDINGS: The anteverted anteflexed uterus is normal in size and configuration, measuring 8.8 x 4.7 x 4.1 cm. No uterine fibroids or other myometrial abnormality. Bilayer endometrial thickness is 11 mm. No endometrial cavity fluid or focal endometrial mass. No evidence of intrauterine gestational sac. Left ovary measures 3.0 x 2.1 x 2.6 cm. Right ovary measures 3.4 x 2.7 x 3.5 cm. No ovarian or adnexal masses. Trace simple free fluid in the pelvic cul-de-sac. IMPRESSION: Non-localization of the pregnancy by ultrasound. No ovarian or adnexal abnormalities. The sonographic differential diagnosis is an early intrauterine gestation too small to visualize, a spontaneous abortion or an occult ectopic gestation.  Recommend close clinical follow-up with serial serum beta HCG monitoring and follow-up obstetric scan in 2-3 weeks or earlier as clinically warranted. Electronically Signed   By: Delbert PhenixJason A Poff M.D.   On: 10/30/2015 17:51    Assessment/Plan: Selinda EonKennetha M Whinery is  25 y.o. G3P2002 at 159w0d presents with Abdominal Cramping Abnormal intrauterine pregnancy. HCG rising but whereas yolk sac visualized on 5/1 u/s, none seen today, and would expect both that as well as cardiac activity by this time. Discussed w/ Dr. Shawnie PonsPratt, in agreement this represents failed pregnancy. No signs ectopic on u/s.  Rh positive. Discussed medical mgmt vs expectant mgmg with patient, she strongly desires the latter. Will need clinic f/u in 2 weeks - pt to schedule, I will send message to admin staff to assist with this. Discussed what to expect in terms of miscarriage; bleeding and infection return precautions discussed.   Cherrie Gauze Markice Torbert 5/19/20171:55 PM

## 2015-12-31 ENCOUNTER — Inpatient Hospital Stay (HOSPITAL_COMMUNITY): Payer: Self-pay

## 2015-12-31 ENCOUNTER — Inpatient Hospital Stay (HOSPITAL_COMMUNITY)
Admission: AD | Admit: 2015-12-31 | Discharge: 2015-12-31 | Disposition: A | Discharge status: Home / Self Care | Payer: Self-pay | Source: Ambulatory Visit | Attending: Obstetrics and Gynecology | Admitting: Obstetrics and Gynecology

## 2015-12-31 ENCOUNTER — Encounter (HOSPITAL_COMMUNITY): Payer: Self-pay | Admitting: *Deleted

## 2015-12-31 DIAGNOSIS — N390 Urinary tract infection, site not specified: Secondary | ICD-10-CM

## 2015-12-31 DIAGNOSIS — Z87891 Personal history of nicotine dependence: Secondary | ICD-10-CM | POA: Insufficient documentation

## 2015-12-31 DIAGNOSIS — D62 Acute posthemorrhagic anemia: Secondary | ICD-10-CM

## 2015-12-31 DIAGNOSIS — O209 Hemorrhage in early pregnancy, unspecified: Secondary | ICD-10-CM

## 2015-12-31 DIAGNOSIS — O039 Complete or unspecified spontaneous abortion without complication: Secondary | ICD-10-CM

## 2015-12-31 DIAGNOSIS — O036 Delayed or excessive hemorrhage following complete or unspecified spontaneous abortion: Secondary | ICD-10-CM | POA: Insufficient documentation

## 2015-12-31 LAB — CBC WITH DIFFERENTIAL/PLATELET
Basophils Absolute: 0 10*3/uL (ref 0.0–0.1)
Basophils Relative: 0 %
EOS PCT: 0 %
Eosinophils Absolute: 0 10*3/uL (ref 0.0–0.7)
HCT: 28.9 % — ABNORMAL LOW (ref 36.0–46.0)
Hemoglobin: 10 g/dL — ABNORMAL LOW (ref 12.0–15.0)
LYMPHS ABS: 2.5 10*3/uL (ref 0.7–4.0)
LYMPHS PCT: 24 %
MCH: 29.9 pg (ref 26.0–34.0)
MCHC: 34.6 g/dL (ref 30.0–36.0)
MCV: 86.5 fL (ref 78.0–100.0)
MONO ABS: 0.5 10*3/uL (ref 0.1–1.0)
MONOS PCT: 5 %
Neutro Abs: 7.3 10*3/uL (ref 1.7–7.7)
Neutrophils Relative %: 71 %
PLATELETS: 251 10*3/uL (ref 150–400)
RBC: 3.34 MIL/uL — AB (ref 3.87–5.11)
RDW: 12.3 % (ref 11.5–15.5)
WBC: 10.3 10*3/uL (ref 4.0–10.5)

## 2015-12-31 LAB — URINALYSIS, ROUTINE W REFLEX MICROSCOPIC
GLUCOSE, UA: 100 mg/dL — AB
Ketones, ur: 15 mg/dL — AB
Nitrite: POSITIVE — AB
PH: 5 (ref 5.0–8.0)
Protein, ur: 100 mg/dL — AB

## 2015-12-31 LAB — URINE MICROSCOPIC-ADD ON

## 2015-12-31 LAB — POCT PREGNANCY, URINE: Preg Test, Ur: POSITIVE — AB

## 2015-12-31 MED ORDER — DEXTROSE 5 % IV SOLN
2.0000 g | Freq: Once | INTRAVENOUS | Status: AC
  Administered 2015-12-31: 2 g via INTRAVENOUS
  Filled 2015-12-31: qty 2

## 2015-12-31 MED ORDER — FERROUS SULFATE 325 (65 FE) MG PO TABS: 325.0000 mg | ORAL_TABLET | Freq: Two times a day (BID) | ORAL | Status: AC

## 2015-12-31 MED ORDER — SODIUM CHLORIDE 0.9 % IV SOLN
INTRAVENOUS | Status: DC
  Administered 2015-12-31: 15:00:00 via INTRAVENOUS

## 2015-12-31 MED ORDER — LACTATED RINGERS IV BOLUS (SEPSIS)
1000.0000 mL | Freq: Once | INTRAVENOUS | Status: AC
  Administered 2015-12-31: 1000 mL via INTRAVENOUS

## 2015-12-31 NOTE — MAU Provider Note (Signed)
History   [id number] @  By dates was seen by Dr. Ashok PallWouk on 5/19 and was told she had a failed pregnancy. In today with c/o heavy bleeding with clots since last night. States passed out earlier today. states bleeding is now a scant amt.  CSN: [id number]  Arrival date & time 12/31/15  1338   None     Chief Complaint  Patient presents with  . Vaginal Bleeding    HPI  Past Medical History  Diagnosis Date  . No pertinent past medical history   . Medical history non-contributory     Past Surgical History  Procedure Laterality Date  . No past surgeries      Family History  Problem Relation Age of Onset  . Asthma Sister   . Diabetes Paternal Aunt     Social History  Substance Use Topics  . Smoking status: Former Smoker -- 0.25 packs/day for 3 years    Quit date: 10/08/2015  . Smokeless tobacco: Never Used  . Alcohol Use: 4.8 oz/week    8 Glasses of wine per week    OB History    Gravida Para Term Preterm AB TAB SAB Ectopic Multiple Living   3 2 2      1 2       Review of Systems  HENT: Negative.   Eyes: Negative.   Respiratory: Negative.   Gastrointestinal: Positive for abdominal pain.  Endocrine: Negative.   Genitourinary: Positive for vaginal bleeding.  Musculoskeletal: Negative.   Skin: Negative.   Allergic/Immunologic: Negative.   Neurological: Positive for dizziness, syncope and weakness.  Hematological: Negative.   Psychiatric/Behavioral: Negative.     Allergies  Review of patient's allergies indicates no known allergies.  Home Medications  No current outpatient prescriptions on file.  BP 103/60 mmHg  Pulse 130  Temp(Src) 97.8 F (36.6 C) (Axillary)  Resp 18  SpO2 100%  LMP 10/04/2015 (Exact Date)  Physical Exam  Constitutional: She is oriented to person, place, and time. She appears well-developed and well-nourished.  HENT:  Head: Normocephalic.  Neck: Normal range of motion.  Cardiovascular: Normal rate, regular rhythm, normal heart sounds  and intact distal pulses.   Pulmonary/Chest: Effort normal and breath sounds normal.  Abdominal: Soft. Bowel sounds are normal.  Musculoskeletal: Normal range of motion.  Neurological: She is alert and oriented to person, place, and time. She has normal reflexes.  Skin: Skin is warm and dry.  Psychiatric: She has a normal mood and affect. Her behavior is normal. Judgment and thought content normal.    MAU Course  Procedures (including critical care time)  Labs Reviewed  URINALYSIS, ROUTINE W REFLEX MICROSCOPIC (NOT AT [facility])  CBC WITH DIFFERENTIAL/PLATELET  URINE RAPID DRUG SCREEN, HOSP PERFORMED   No results found.   1. Bleeding in early pregnancy       MDM  Spont ab, bleeding scant amt. Will d/c home on fe. UTI pt treated with 2gm rocephen and culture sent

## 2015-12-31 NOTE — MAU Note (Signed)
Pt states that last night she started bleeding really bad last night.  Pt states she felt lightheaded and this morning she went to the store to get some soup and she feel.  Pt states her sister brought her here from the store.

## 2015-12-31 NOTE — Discharge Instructions (Signed)
Miscarriage °A miscarriage is the sudden loss of an unborn baby (fetus) before the 20th week of pregnancy. Most miscarriages happen in the first 3 months of pregnancy. Sometimes, it happens before a woman even knows she is pregnant. A miscarriage is also called a "spontaneous miscarriage" or "early pregnancy loss." Having a miscarriage can be an emotional experience. Talk with your caregiver about any questions you may have about miscarrying, the grieving process, and your future pregnancy plans. °CAUSES  °· Problems with the fetal chromosomes that make it impossible for the baby to develop normally. Problems with the baby's genes or chromosomes are most often the result of errors that occur, by chance, as the embryo divides and grows. The problems are not inherited from the parents. °· Infection of the cervix or uterus.   °· Hormone problems.   °· Problems with the cervix, such as having an incompetent cervix. This is when the tissue in the cervix is not strong enough to hold the pregnancy.   °· Problems with the uterus, such as an abnormally shaped uterus, uterine fibroids, or congenital abnormalities.   °· Certain medical conditions.   °· Smoking, drinking alcohol, or taking illegal drugs.   °· Trauma.   °Often, the cause of a miscarriage is unknown.  °SYMPTOMS  °· Vaginal bleeding or spotting, with or without cramps or pain. °· Pain or cramping in the abdomen or lower back. °· Passing fluid, tissue, or blood clots from the vagina. °DIAGNOSIS  °Your caregiver will perform a physical exam. You may also have an ultrasound to confirm the miscarriage. Blood or urine tests may also be ordered. °TREATMENT  °· Sometimes, treatment is not necessary if you naturally pass all the fetal tissue that was in the uterus. If some of the fetus or placenta remains in the body (incomplete miscarriage), tissue left behind may become infected and must be removed. Usually, a dilation and curettage (D and C) procedure is performed.  During a D and C procedure, the cervix is widened (dilated) and any remaining fetal or placental tissue is gently removed from the uterus. °· Antibiotic medicines are prescribed if there is an infection. Other medicines may be given to reduce the size of the uterus (contract) if there is a lot of bleeding. °· If you have Rh negative blood and your baby was Rh positive, you will need a Rh immunoglobulin shot. This shot will protect any future baby from having Rh blood problems in future pregnancies. °HOME CARE INSTRUCTIONS  °· Your caregiver may order bed rest or may allow you to continue light activity. Resume activity as directed by your caregiver. °· Have someone help with home and family responsibilities during this time.   °· Keep track of the number of sanitary pads you use each day and how soaked (saturated) they are. Write down this information.   °· Do not use tampons. Do not douche or have sexual intercourse until approved by your caregiver.   °· Only take over-the-counter or prescription medicines for pain or discomfort as directed by your caregiver.   °· Do not take aspirin. Aspirin can cause bleeding.   °· Keep all follow-up appointments with your caregiver.   °· If you or your partner have problems with grieving, talk to your caregiver or seek counseling to help cope with the pregnancy loss. Allow enough time to grieve before trying to get pregnant again.   °SEEK IMMEDIATE MEDICAL CARE IF:  °· You have severe cramps or pain in your back or abdomen. °· You have a fever. °· You pass large blood clots (walnut-sized   or larger) ortissue from your vagina. Save any tissue for your caregiver to inspect.   Your bleeding increases.   You have a thick, bad-smelling vaginal discharge.  You become lightheaded, weak, or you faint.   You have chills.  MAKE SURE YOU:  Understand these instructions.  Will watch your condition.  Will get help right away if you are not doing well or get worse.   This  information is not intended to replace advice given to you by your health care provider. Make sure you discuss any questions you have with your health care provider.   Document Released: 12/19/2000 Document Revised: 10/20/2012 Document Reviewed: 08/14/2011 Elsevier Interactive Patient Education 2016 [employer] Asymptomatic Bacteriuria, Female Asymptomatic bacteriuria is the presence of a large number of bacteria in your urine without the usual symptoms of burning or frequent urination. The following conditions increase the risk of asymptomatic bacteriuria:  Diabetes mellitus.  Advanced age.  Pregnancy in the first trimester.  Kidney stones.  Kidney transplants.  Leaky kidney tube valve in young children (reflux). Treatment for this condition is not needed in most people and can lead to other problems such as too much yeast and growth of resistant bacteria. However, some people, such as pregnant women, do need treatment to prevent kidney infection. Asymptomatic bacteriuria in pregnancy is also associated with fetal growth restriction, premature labor, and newborn death. HOME CARE INSTRUCTIONS Monitor your condition for any changes. The following actions may help to relieve any discomfort you are feeling:  Drink enough water and fluids to keep your urine clear or pale yellow. Go to the bathroom more often to keep your bladder empty.  Keep the area around your vagina and rectum clean. Wipe yourself from front to back after urinating. SEEK IMMEDIATE MEDICAL CARE IF:  You develop signs of an infection such as:  Burning with urination.  Frequency of voiding.  Back pain.  Fever.  You have blood in the urine.  You develop a fever. MAKE SURE YOU:  Understand these instructions.  Will watch your condition.  Will get help right away if you are not doing well or get worse.   This information is not intended to replace advice given to you by your health care provider. Make sure  you discuss any questions you have with your health care provider.   Document Released: 06/25/2005 Document Revised: 07/16/2014 Document Reviewed: 12/15/2012 Elsevier Interactive Patient Education [employer] Inc.

## 2016-01-01 LAB — URINE CULTURE

## 2016-01-02 ENCOUNTER — Encounter: Payer: Self-pay | Admitting: *Deleted

## 2016-01-02 ENCOUNTER — Telehealth: Payer: Self-pay | Admitting: *Deleted

## 2016-01-02 ENCOUNTER — Ambulatory Visit: Payer: Self-pay | Admitting: Obstetrics and Gynecology

## 2016-01-02 NOTE — Telephone Encounter (Signed)
Dana Santana missed a scheduled appointment for followup after sab. I called and left a message that I was calling because she missed an appt- and needs to call to reschedule. I will also send a letter.

## 2016-09-02 ENCOUNTER — Encounter (HOSPITAL_COMMUNITY): Payer: Self-pay

## 2016-11-02 IMAGING — US US OB TRANSVAGINAL
1 series · 15 of 24 positions shown · non-contrast
Comparison: 10/30/2015

CLINICAL DATA: Vaginal bleeding this morning. Cramping on off for
several days. Estimated gestational age by LMP is 4 weeks 6 days.
Quantitative beta HCG is 3647 on 11/04/15 and was rising.

EXAM:
TRANSVAGINAL OB ULTRASOUND
TECHNIQUE: Transvaginal ultrasound was performed for complete evaluation of the
gestation as well as the maternal uterus, adnexal regions, and
pelvic cul-de-sac.

[Series 1: us ob transvaginal · 15 of 24 slices shown]
[im 1/24]
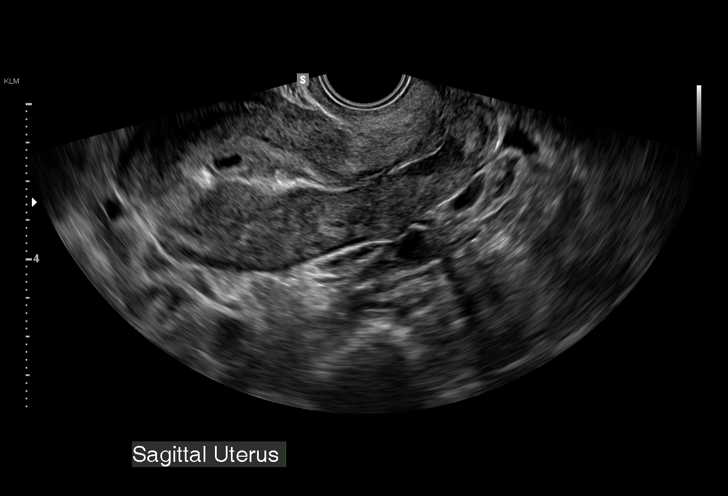
[im 3/24]
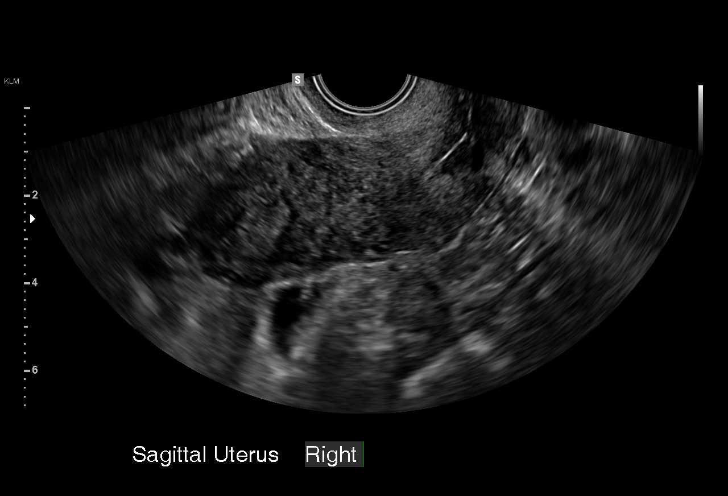
[im 5/24]
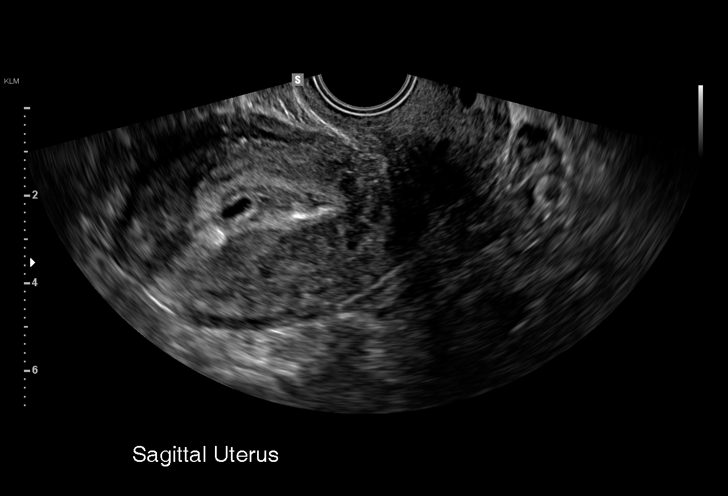
[im 6/24]
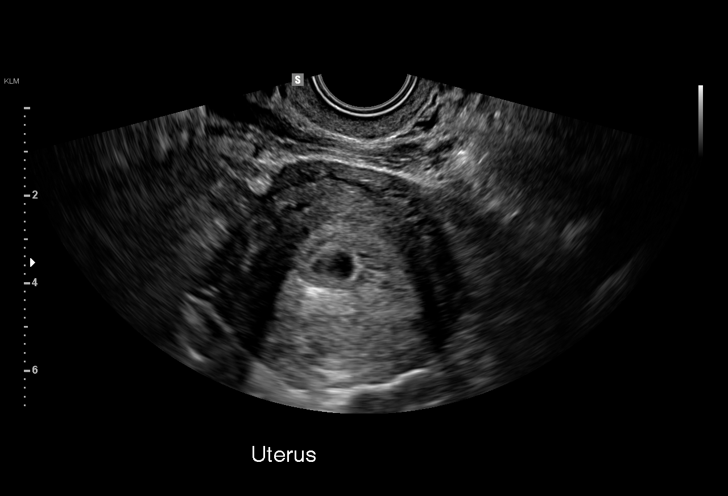
[im 8/24]
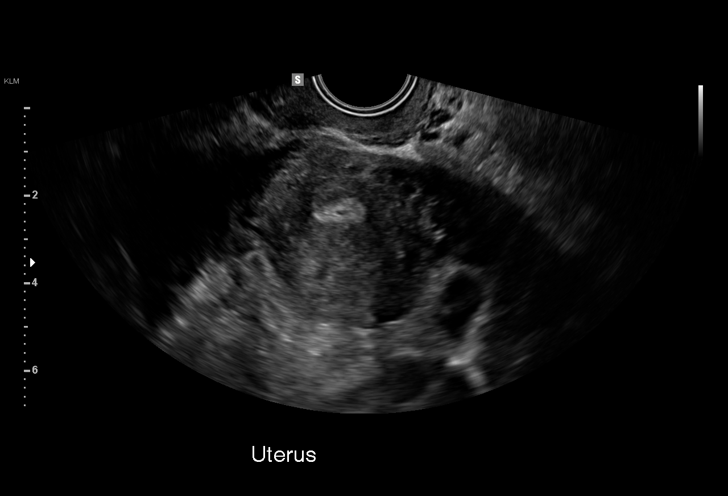
[im 9/24]
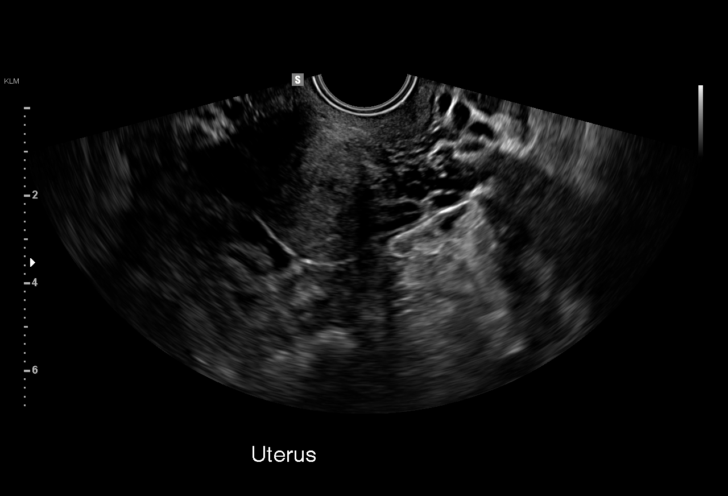
[im 11/24]
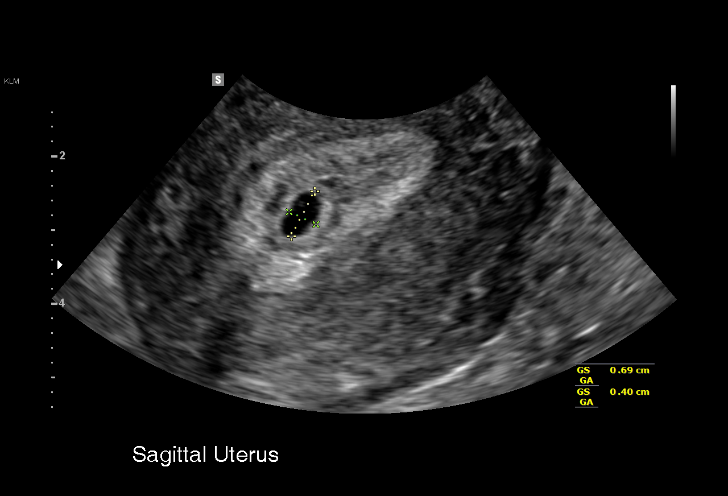
[im 13/24]
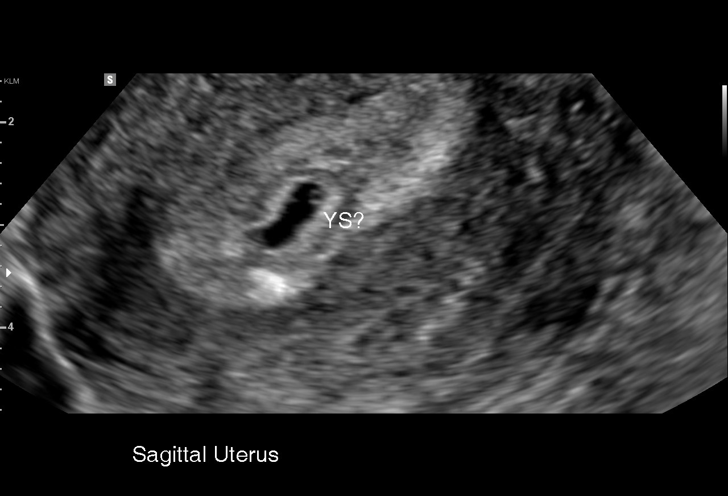
[im 14/24]
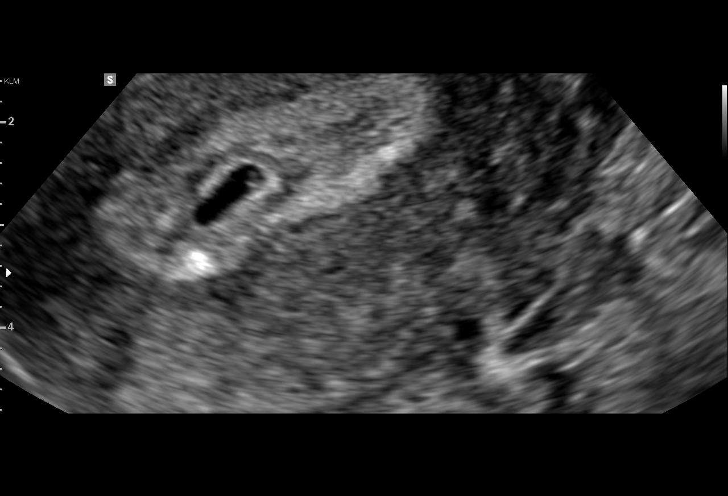
[im 16/24]
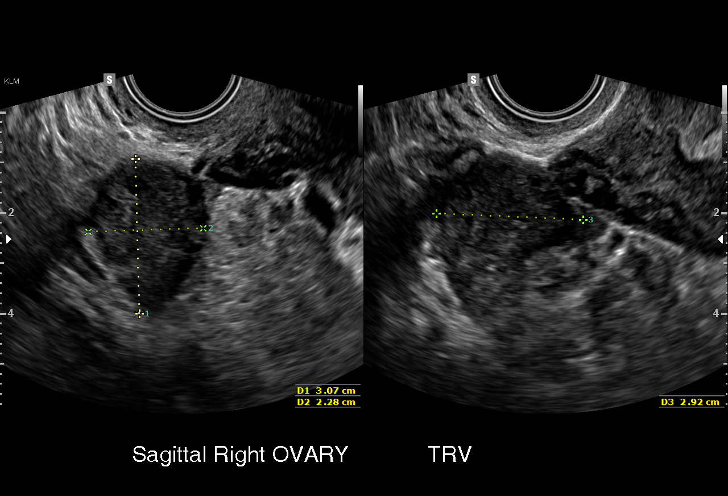
[im 17/24]
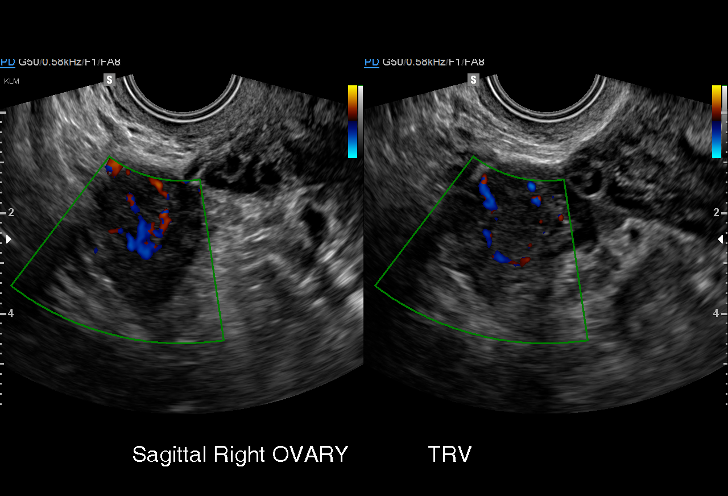
[im 19/24]
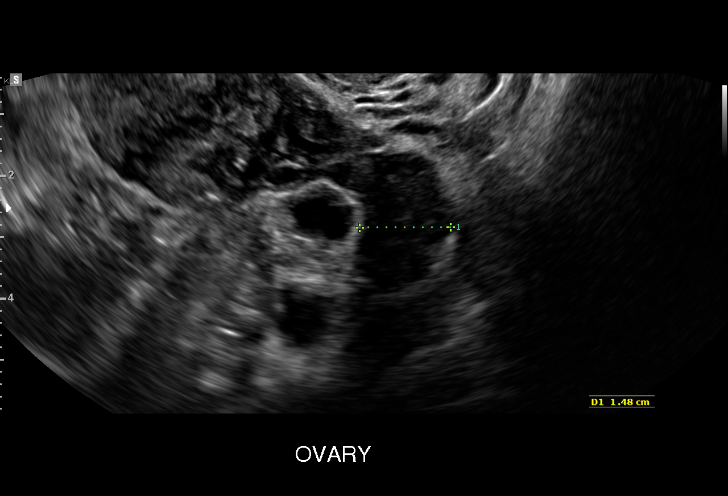
[im 21/24]
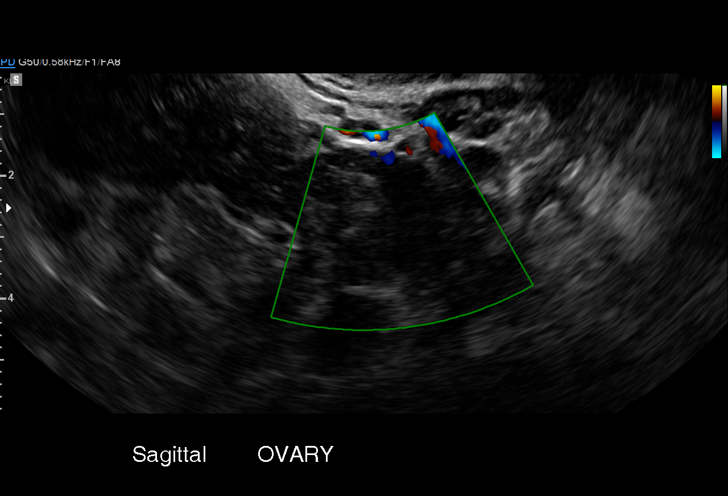
[im 22/24]
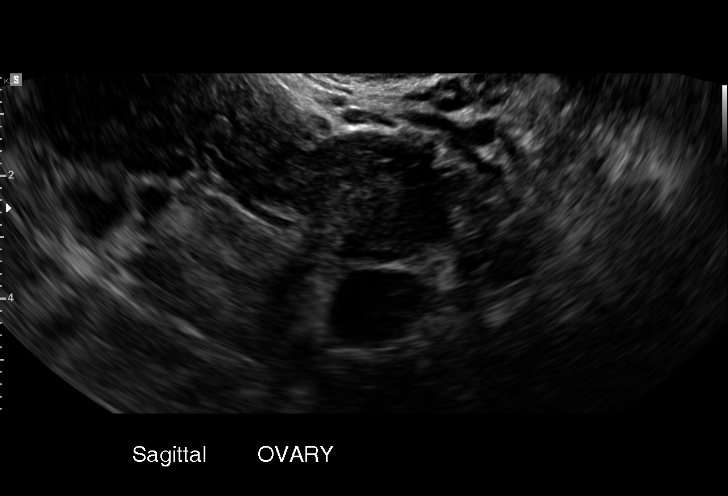
[im 24/24]
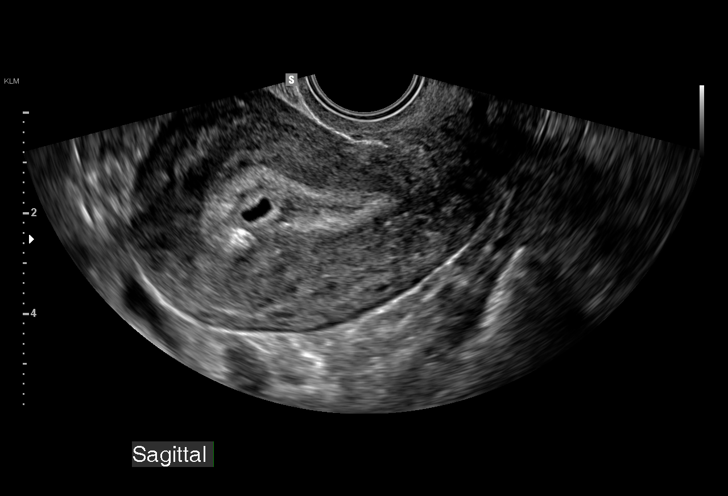

[15 of 24 positions shown; findings below may reference images not displayed]

FINDINGS: Intrauterine gestational sac: A single intrauterine gestational sac
is visualized.

Yolk sac:  Yolk sac is present.

Embryo:  Fetal pole is not identified.

Cardiac Activity: Not identified.

MSD: 6.8  mm   5 w   3  d

Subchorionic hemorrhage:  None visualized.

Maternal uterus/adnexae: The uterus is mildly anteverted. No
myometrial mass lesions identified. Both ovaries are visualized and
appear normal. Small corpus luteal cysts is suggested on the right
ovary. No free fluid.
IMPRESSION: Single intrauterine gestational sac with yolk sac. Estimated
gestational age by mean sac diameter is 5 weeks 3 days. Fetal pole
is not identified, likely due to early pregnancy. Follow-up as
clinically indicated.

## 2016-11-12 IMAGING — US US OB TRANSVAGINAL
1 series · 15 of 28 positions shown · non-contrast
Comparison: 11/07/2015

CLINICAL DATA: Pregnancy with inconclusive viability, pain x2 weeks

EXAM:
TRANSVAGINAL OB ULTRASOUND
TECHNIQUE: Transvaginal ultrasound was performed for complete evaluation of the
gestation as well as the maternal uterus, adnexal regions, and
pelvic cul-de-sac.

[Series 1: us ob transvaginal · 15 of 36 slices shown]
[im 1/36]
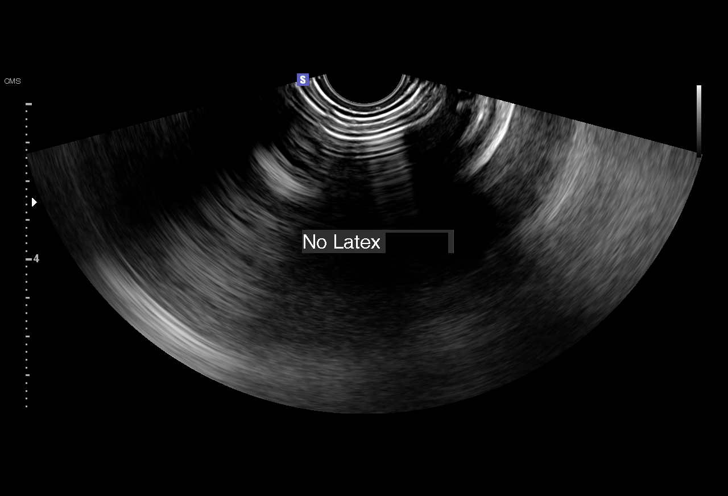
[im 3/36]
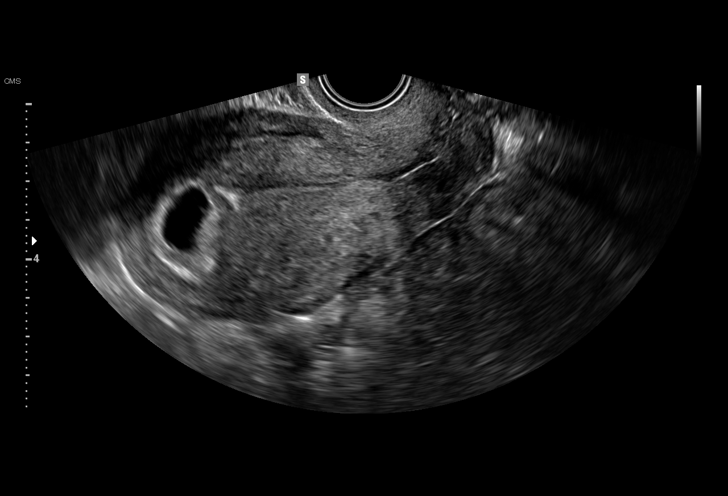
[im 6/36]
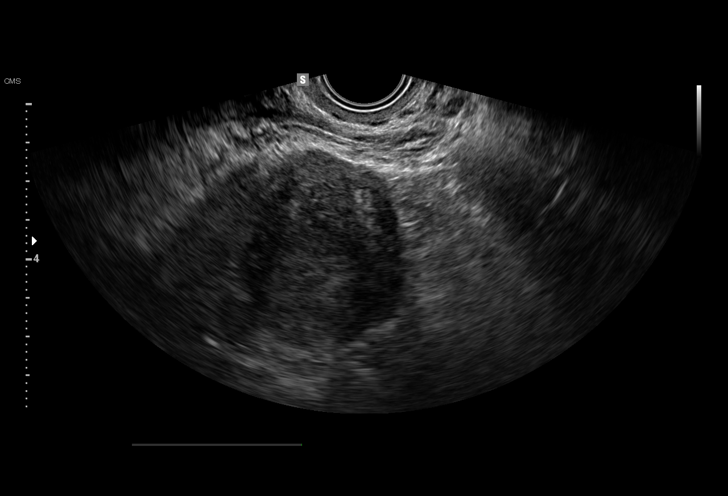
[im 8/36]
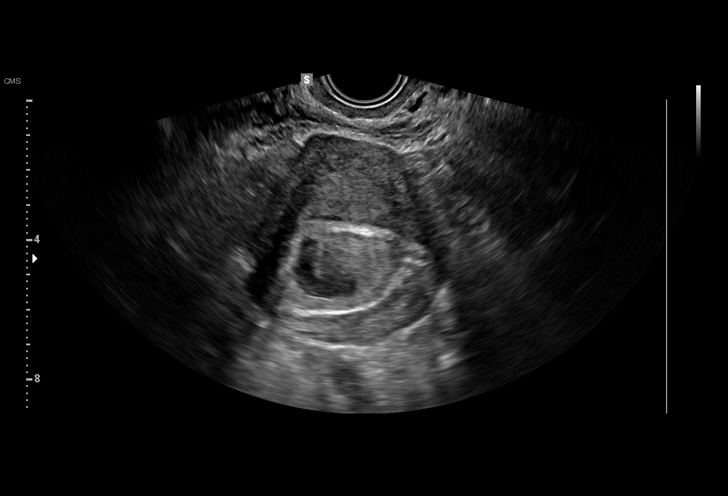
[im 11/36]
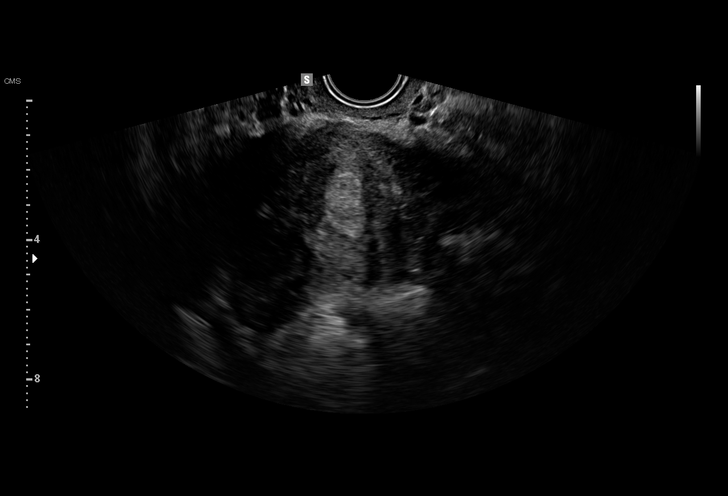
[im 13/36]
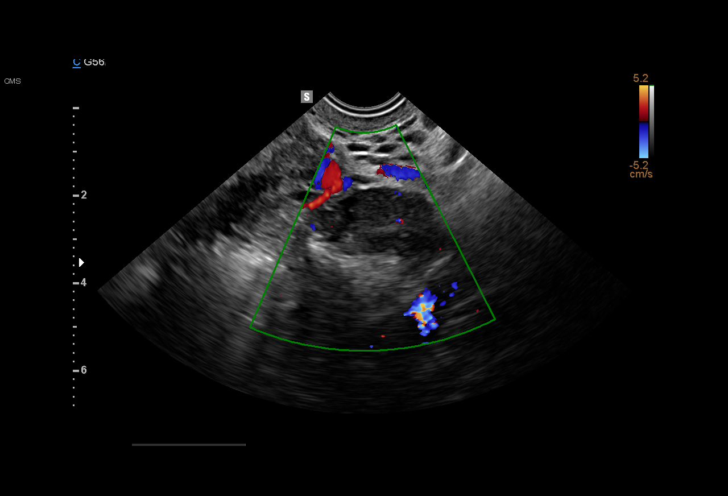
[im 16/36]
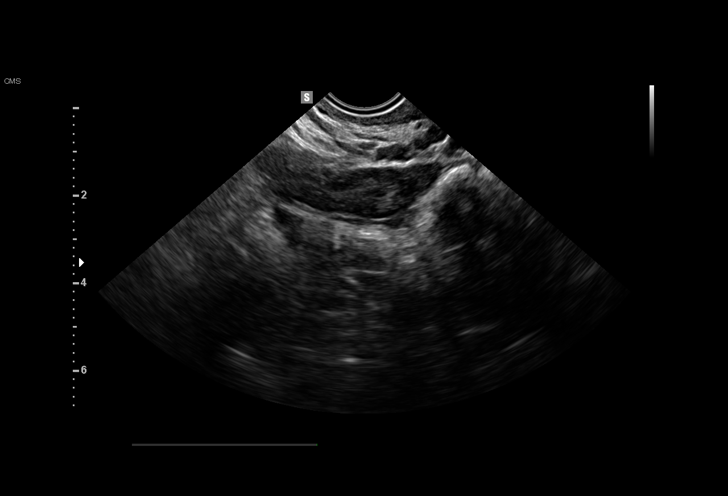
[im 19/36]
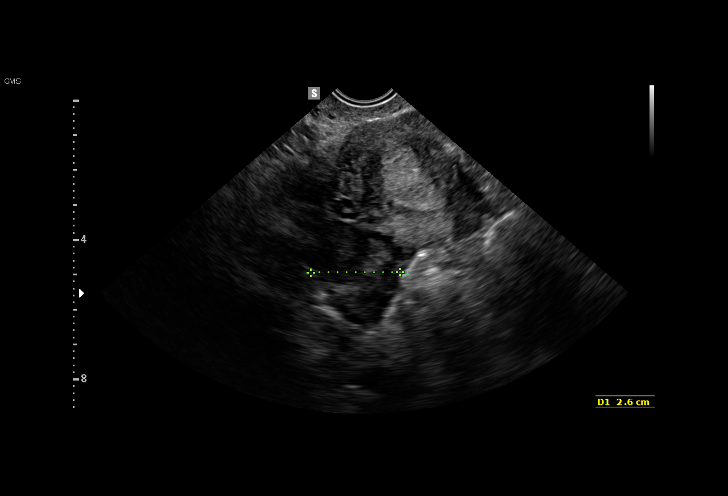
[im 20/36]
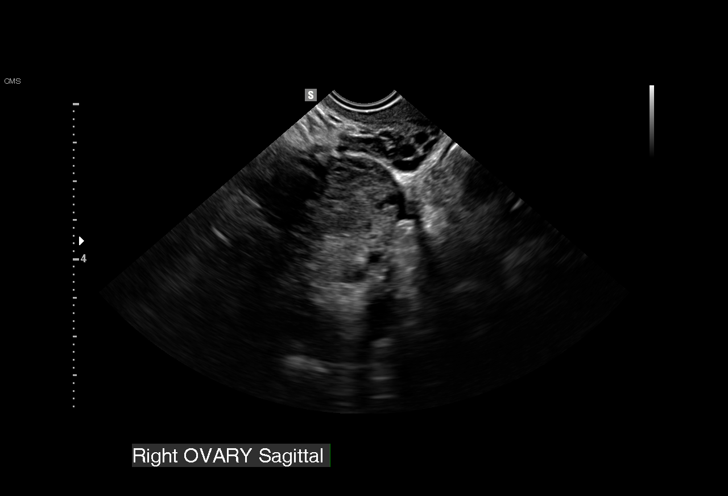
[im 23/36]
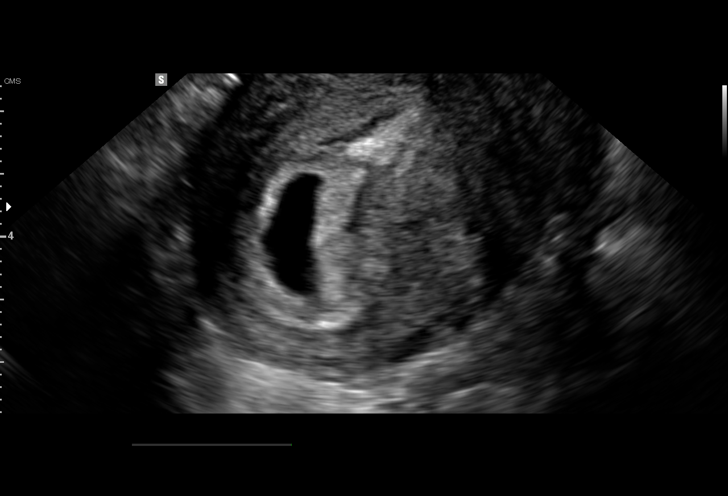
[im 25/36]
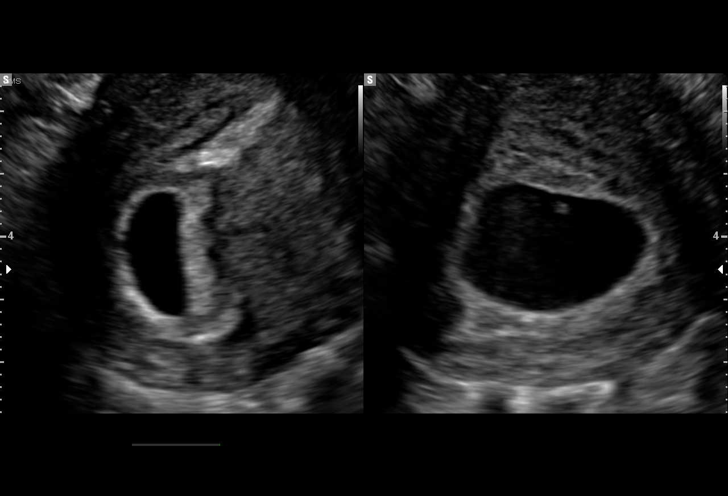
[im 28/36]
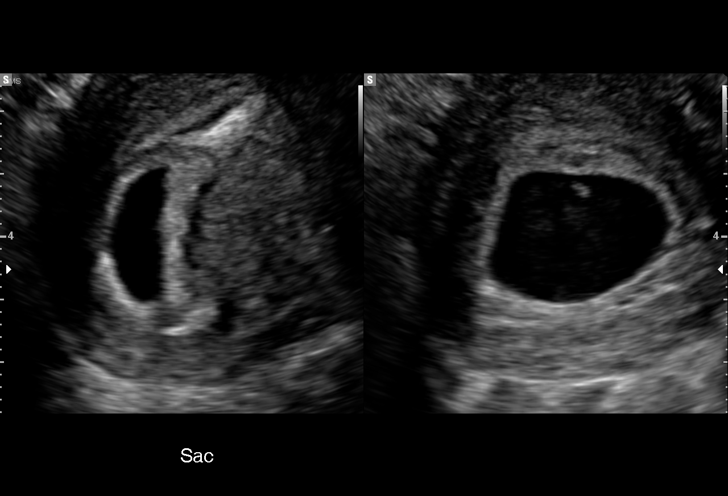
[im 30/36]
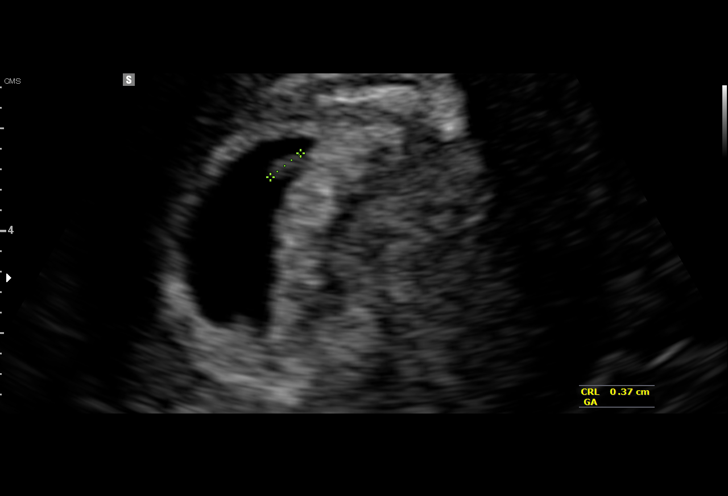
[im 33/36]
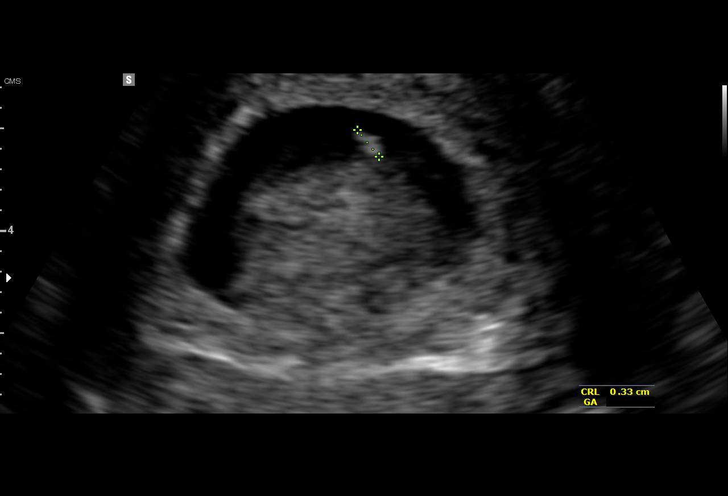
[im 36/36]
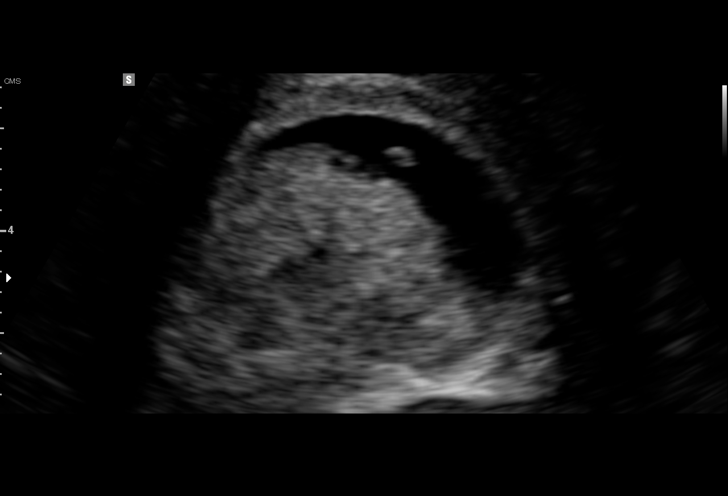

[15 of 28 positions shown; findings below may reference images not displayed]

FINDINGS: Intrauterine gestational sac: Single

Yolk sac:  Suspected

Embryo:  Suspected

Cardiac Activity: Not visualized

CRL:   3.5  mm   6 w 0 d                  US EDC: 07/13/2015

Subchorionic hemorrhage:  None visualized.

Maternal uterus/adnexae: Bilateral ovaries are within normal limits.

No free fluid.
IMPRESSION: Single intrauterine gestational sac with suspected yolk sac and
early fetal pole, measuring 6 weeks 0 days by mean sac diameter. No
cardiac activity is visualized, although this may be related to the
small size of the suspected fetal pole.

Correlate with beta HCG; if not appropriately elevated relative to
the most recent prior value(s), this appearance would be considered
suspicious for failed pregnancy.

Serial beta HCG is suggested. Consider follow-up pelvic ultrasound
in 10-14 days as clinically warranted.

## 2016-11-20 IMAGING — US US OB TRANSVAGINAL
1 series · 15 of 28 positions shown · non-contrast
Comparison: 11/17/2015

CLINICAL DATA: Pelvic pain and cramping. Bleeding a few weeks ago,
none currently.

EXAM:
TRANSVAGINAL OB ULTRASOUND
TECHNIQUE: Transvaginal ultrasound was performed for complete evaluation of the
gestation as well as the maternal uterus, adnexal regions, and
pelvic cul-de-sac.

[Series 1: us ob transvaginal · 15 of 66 slices shown]
[im 1/66]
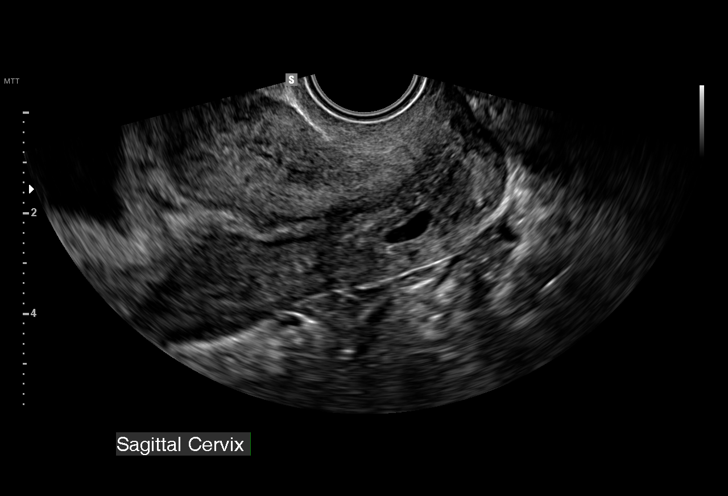
[im 5/66]
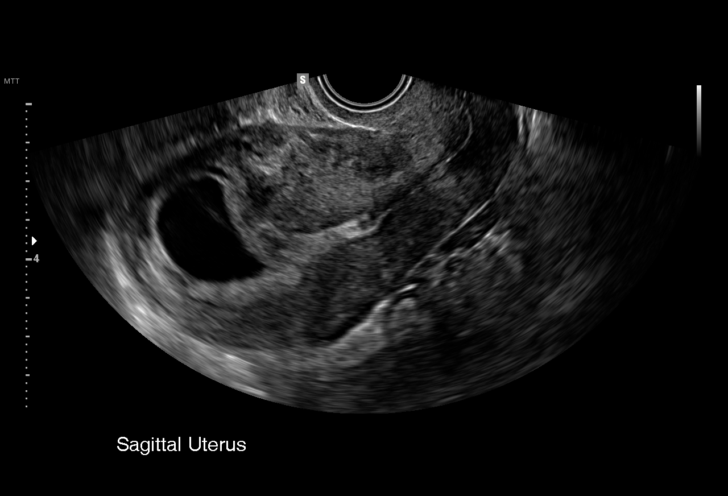
[im 10/66]
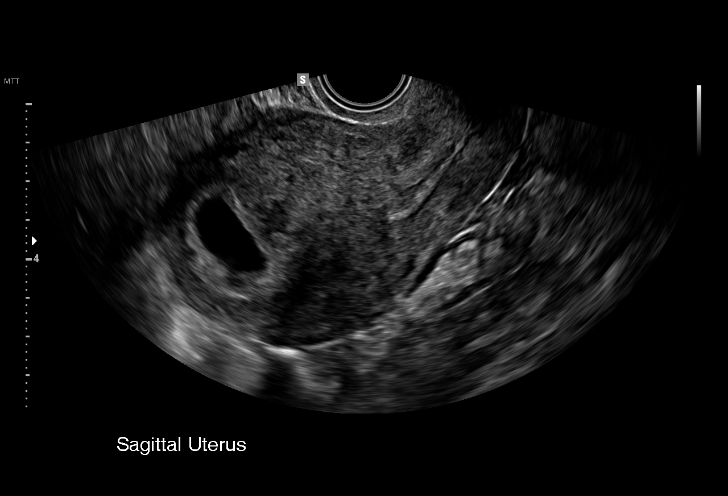
[im 15/66]
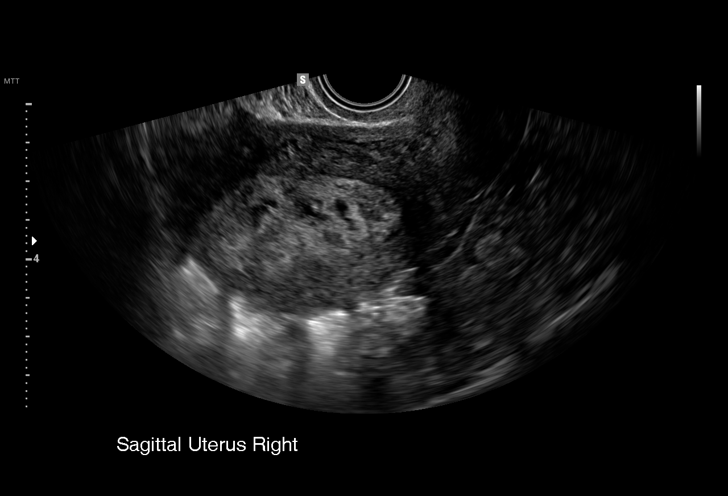
[im 20/66]
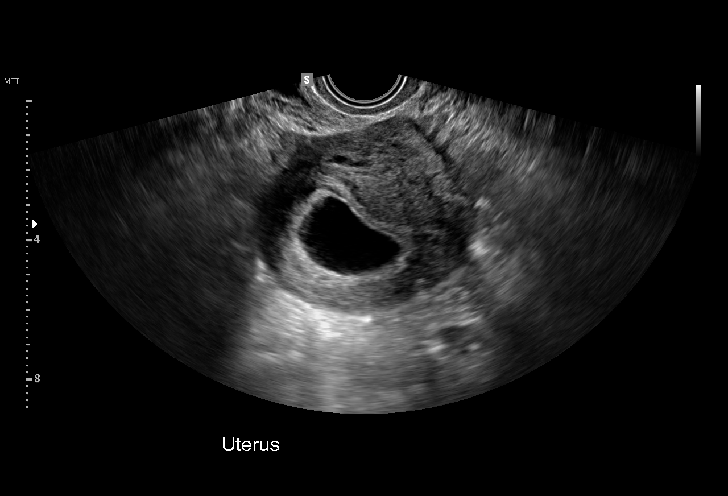
[im 25/66]
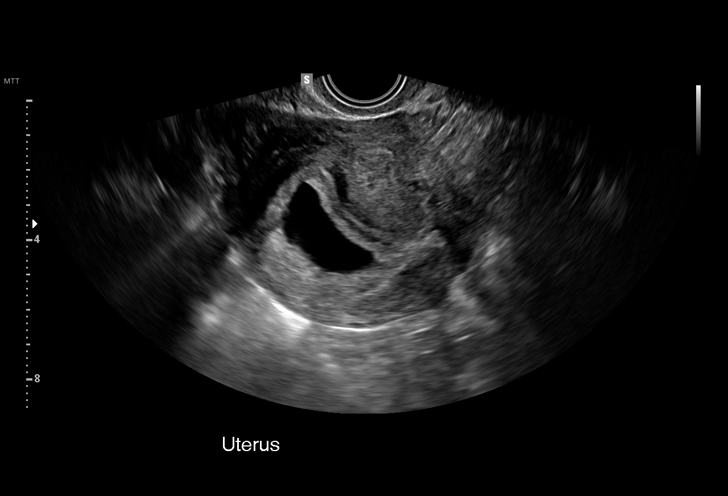
[im 29/66]
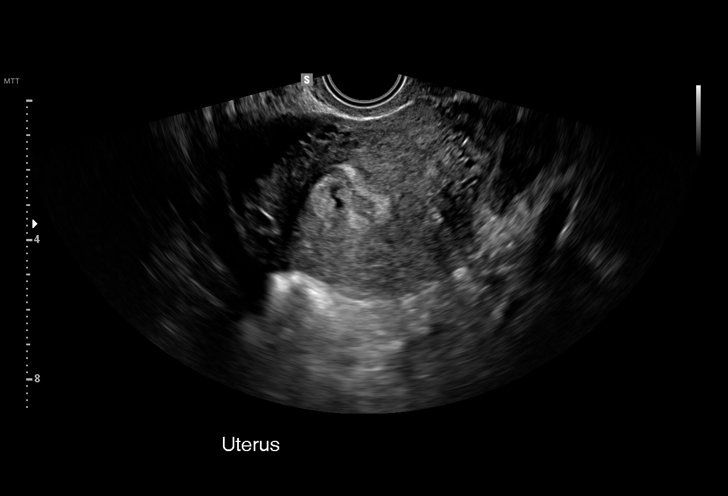
[im 34/66]
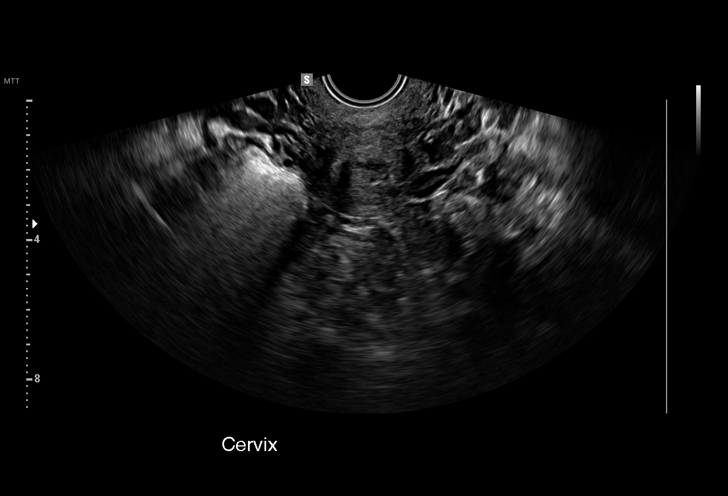
[im 37/66]
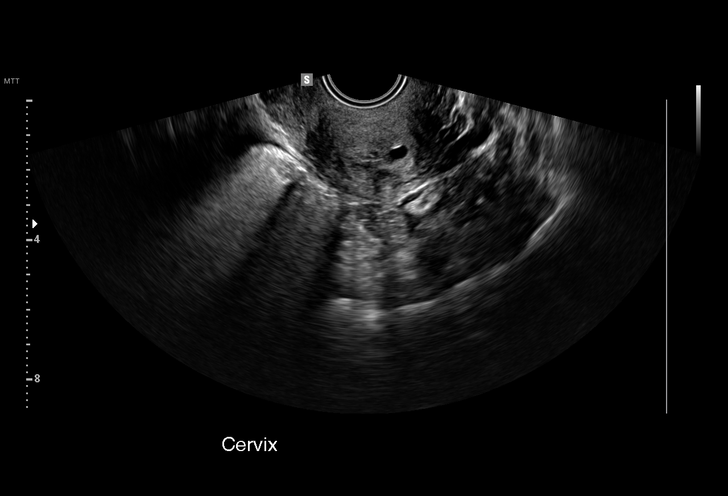
[im 41/66]
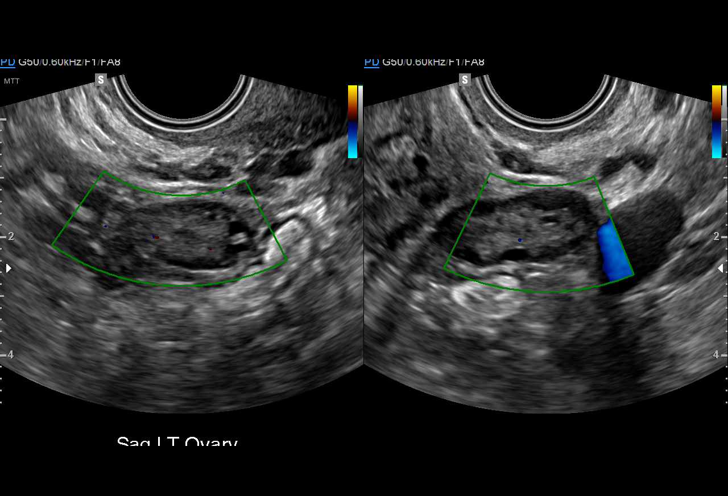
[im 46/66]
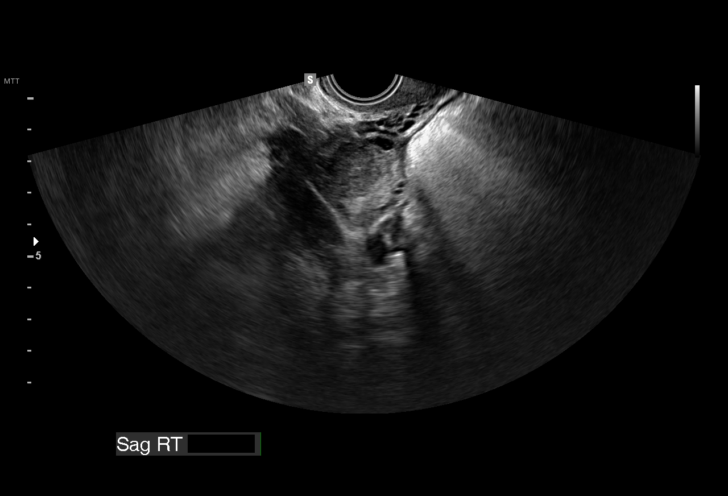
[im 51/66]
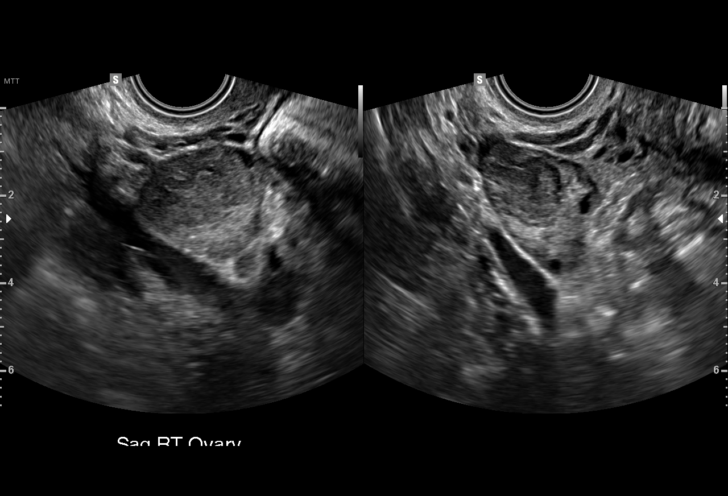
[im 56/66]
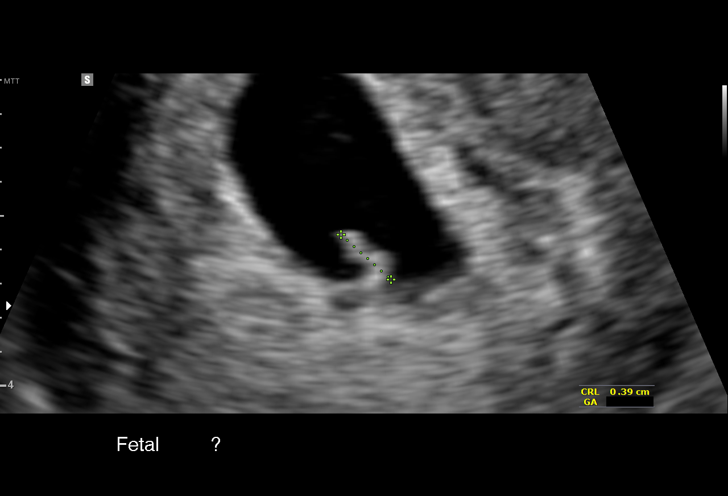
[im 61/66]
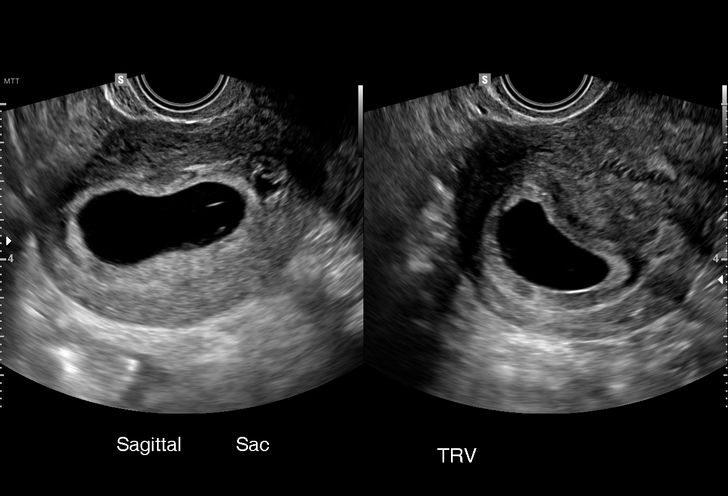
[im 66/66]
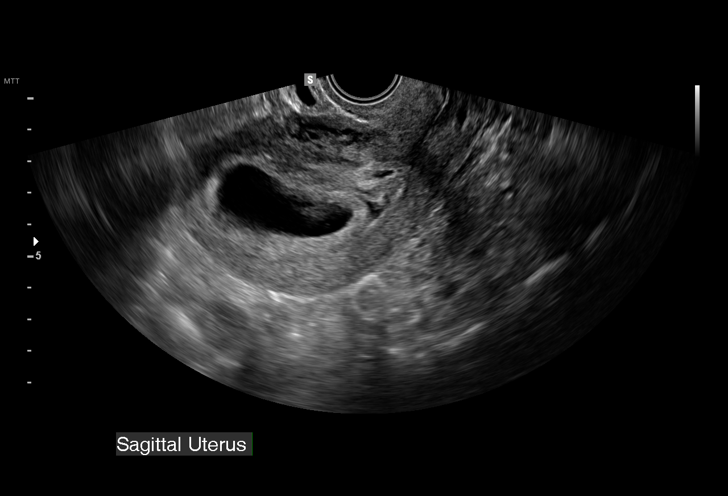

[15 of 28 positions shown; findings below may reference images not displayed]

FINDINGS: Intrauterine gestational sac: Single

Yolk sac:  Not identified.

Embryo:  Suspected but only slightly larger than on the prior study.

Cardiac Activity: None visualized.

CRL:   4.3  mm   6 w 1 d                  US EDC: 07/19/2016

Subchorionic hemorrhage:  Small.

Maternal uterus/adnexae: Unremarkable ovaries.  No free fluid.
IMPRESSION: Single intrauterine gestation with only slight interval enlargement
of suspected fetal pole and persistent lack of detectable cardiac
activity. This is suspicious for failed pregnancy.

## 2017-03-24 ENCOUNTER — Ambulatory Visit (HOSPITAL_COMMUNITY)
Admission: EM | Admit: 2017-03-24 | Discharge: 2017-03-24 | Disposition: A | Discharge status: Home / Self Care | Payer: Self-pay | Attending: Family Medicine | Admitting: Family Medicine

## 2017-03-24 ENCOUNTER — Encounter (HOSPITAL_COMMUNITY): Payer: Self-pay | Admitting: Emergency Medicine

## 2017-03-24 DIAGNOSIS — S41112A Laceration without foreign body of left upper arm, initial encounter: Secondary | ICD-10-CM

## 2017-03-24 MED ORDER — LIDOCAINE-EPINEPHRINE (PF) 2 %-1:200000 IJ SOLN
INTRAMUSCULAR | Status: AC
  Filled 2017-03-24: qty 20

## 2017-03-24 NOTE — ED Provider Notes (Signed)
[facility]    CSN: [id number] Arrival date & time: 03/24/17  1742     History   Chief Complaint Chief Complaint  Patient presents with  . Extremity Laceration    HPI Dana Santana is a 26 y.o. female.   26 yo black female presents with a left upper arm laceration that occurred 2-3 hours before visit. She reports being hit by a glass mug. She denies that it was broken or shattered. History is vague even when prompted.  She is crying. She reports being up to date with tetanus.       Past Medical History:  Diagnosis Date  . Medical history non-contributory   . No pertinent past medical history     There are no active problems to display for this patient.   Past Surgical History:  Procedure Laterality Date  . NO PAST SURGERIES      OB History    Gravida Para Term Preterm AB Living   SAB TAB Ectopic Multiple Live Births         0 2       Home Medications    Prior to Admission medications   Medication Sig Start Date End Date Taking? Authorizing Provider  ferrous sulfate 325 (65 FE) MG tablet Take 1 tablet (325 mg total) by mouth 2 (two) times daily with a meal. 12/31/15   Montez Morita, CNM    Family History Family History  Problem Relation Age of Onset  . Asthma Sister   . Diabetes Paternal Aunt     Social History Social History  Substance Use Topics  . Smoking status: Former Smoker    Packs/day: 0.25    Years: 3.00    Quit date: 10/08/2015  . Smokeless tobacco: Never Used  . Alcohol use 4.8 oz/week    8 Glasses of wine per week     Allergies   Benadryl [diphenhydramine]   Review of Systems Review of Systems  All other systems reviewed and are negative.    Physical Exam Triage Vital Signs ED Triage Vitals  Enc Vitals Group     BP 03/24/17 1755 (!) 139/96     Pulse Rate 03/24/17 1755 (!) 130     Resp 03/24/17 1755 (!) 22     Temp 03/24/17 1755 99.6 F (37.6 C)     Temp Source 03/24/17 1755 Oral   SpO2 03/24/17 1755 97 %     Weight --      Height --      Head Circumference --      Peak Flow --      Pain Score 03/24/17 1758 5     Pain Loc --      Pain Edu? --      Excl. in GC? --    No data found.   Updated Vital Signs BP (!) 139/96 (BP Location: Left Arm)   Pulse (!) 130   Temp 99.6 F (37.6 C) (Oral)   Resp (!) 22   LMP 03/24/2017   SpO2 97%   Breastfeeding? No   Visual Acuity Right Eye Distance:   Left Eye Distance:   Bilateral Distance:    Right Eye Near:   Left Eye Near:    Bilateral Near:     Physical Exam  Constitutional: She is oriented to person, place, and time. She appears well-developed and well-nourished.  Cardiovascular:  HR down to 101 once relaxed  Neurological:  She is alert and oriented to person, place, and time.  Skin: Skin is warm and dry.     Left upper arm laceration approximately 10 cm in length. 1cm in width. Clean without evidence of FB  Psychiatric: Her behavior is normal.  Crying the entire visit  Nursing note and vitals reviewed.    UC Treatments / Results  Labs (all labs ordered are listed, but only abnormal results are displayed) Labs Reviewed - No data to display  EKG  EKG Interpretation None       Radiology No results found.  Procedures .Marland KitchenLaceration Repair Date/Time: 03/24/2017 6:42 PM Performed by: Riki Sheer Authorized by: Elvina Sidle   Consent:    Consent obtained:  Verbal   Consent given by:  Patient   Risks discussed:  Pain   Alternatives discussed:  No treatment Anesthesia (see MAR for exact dosages):    Anesthesia method:  Local infiltration   Local anesthetic:  Lidocaine 2% WITH epi Laceration details:    Location:  Shoulder/arm   Shoulder/arm location:  L upper arm   Length (cm):  10 Pre-procedure details:    Preparation:  Patient was prepped and draped in usual sterile fashion Exploration:    Wound exploration: wound explored through full range of motion and entire depth of  wound probed and visualized     Wound extent: fascia violated     Wound extent: no foreign bodies/material noted, no muscle damage noted, no nerve damage noted, no tendon damage noted, no underlying fracture noted and no vascular damage noted     Contaminated: no   Treatment:    Area cleansed with:  Betadine and saline   Amount of cleaning:  Extensive   Irrigation solution:  Sterile saline   Foreign body removal: NO FB.   Skin repair:    Repair method:  Sutures   Suture size:  4-0   Suture material:  Prolene   Number of sutures:  8 Approximation:    Approximation:  Close   Vermilion border: well-aligned   Post-procedure details:    Dressing:  Adhesive bandage   Patient tolerance of procedure:  Tolerated well, no immediate complications   (including critical care time)  Medications Ordered in UC Medications - No data to display   Initial Impression / Assessment and Plan / UC Course  I have reviewed the triage vital signs and the nursing notes.  Pertinent labs & imaging results that were available during my care of the patient were reviewed by me and considered in my medical decision making (see chart for details).    See procedure note. After sterile cleaning and assurance of no FB, Horizontal followed by veritcal sutures used for closed approximation. Questions were asked regarding description of injury and patient continued with same story. She denies feeling in harms way or abuse. She will return in 7-8 days for suture removal and instructed to return if needed prior to that.   Final Clinical Impressions(s) / UC Diagnoses   Final diagnoses:  Arm laceration, left, initial encounter    New Prescriptions New Prescriptions   No medications on file     Controlled Substance Prescriptions [place] Controlled Substance Registry consulted? Not Applicable   Sharin Mons 03/24/17 1610

## 2017-03-24 NOTE — ED Triage Notes (Signed)
Pt reports lac to left arm onset 30 min ago ... 6 cm   Reports she opened the cabinet and a glass cup fell and hit her arm  Pt is crying   A&O x4... NAD... Ambulatory

## 2017-03-24 NOTE — Discharge Instructions (Signed)
Please keep wound clean and dry for the next 24-36 hours. Following this ok to shower and let water run over region but do not submerge. FU in 7-8 days for suture removal. Call if needed or concerns. Nice to meet you.

## 2017-04-02 ENCOUNTER — Emergency Department (HOSPITAL_COMMUNITY): Admission: EM | Admit: 2017-04-02 | Discharge: 2017-04-02 | Discharge status: Absent without leave | Payer: Self-pay

## 2017-04-02 NOTE — ED Notes (Signed)
Pt came to nurses station stating she would go somewhere else to have her sutures removed. Informed pt that the wait for the area she would be going to would not be as long as the wait for a room. Pt stated she would like to leave at this time.

## 2017-04-04 ENCOUNTER — Ambulatory Visit (HOSPITAL_COMMUNITY)
Admission: EM | Admit: 2017-04-04 | Discharge: 2017-04-04 | Disposition: A | Discharge status: Home / Self Care | Payer: Self-pay

## 2017-04-04 NOTE — ED Triage Notes (Addendum)
Pt here for suture removal to her left upper arm.  The wound was clean, dry, and intact.  8 sutures were removed.  Pt tolerated well.  Pt denied any issues.

## 2023-04-08 ENCOUNTER — Ambulatory Visit (HOSPITAL_COMMUNITY)
Admission: EM | Admit: 2023-04-08 | Discharge: 2023-04-08 | Disposition: A | Discharge status: Home / Self Care | Payer: Self-pay

## 2023-04-08 ENCOUNTER — Encounter (HOSPITAL_COMMUNITY): Payer: Self-pay | Admitting: Emergency Medicine

## 2023-04-08 DIAGNOSIS — R030 Elevated blood-pressure reading, without diagnosis of hypertension: Secondary | ICD-10-CM

## 2023-04-08 DIAGNOSIS — F439 Reaction to severe stress, unspecified: Secondary | ICD-10-CM

## 2023-04-08 NOTE — Discharge Instructions (Signed)
Your blood pressure is little bit elevated today but I believe this is related to situational stress.  Try to avoid decongestants, caffeine, sodium, NSAIDs including aspirin, ibuprofen/Advil, naproxen/Aleve.  Make sure you are drinking plenty of fluid.  Monitor your blood pressure at home.  If this is persistently above 130/85 please return as we can consider medication.  If you develop any chest pain, shortness of breath, headache, vision change, dizziness in setting of high blood pressure you need to be seen immediately.

## 2023-04-08 NOTE — ED Provider Notes (Signed)
[facility]    CSN: [id number] Arrival date & time: 04/08/23  1012      History   Chief Complaint No chief complaint on file.   HPI Dana Santana is a 32 y.o. female.   Patient presents today to have her vitals checked and for documentation in order to secure new ID.  She recently got out of a bad situation and as result lost her Social Security card and ID.  She needs documentation that she was seen by medical clinic with a provider signature that includes her name and address.  Her blood pressure was noted to be elevated.  She is not experiencing any symptoms and denies any headache, chest pain, shortness of breath, vision change, dizziness.  She denies any significant past medical history.  She does not take medication on a regular basis including NSAIDs or decongestants.  She does not monitor her diet for salt.  She denies formal diagnosis of hypertension.  She has never taken antihypertensive medication in the past.    Past Medical History:  Diagnosis Date   Medical history non-contributory    No pertinent past medical history     There are no problems to display for this patient.   Past Surgical History:  Procedure Laterality Date   NO PAST SURGERIES      OB History     Gravida  3   Para  2   Term  2   Preterm      AB      Living  2      SAB      IAB      Ectopic      Multiple  0   Live Births  2            Home Medications    Prior to Admission medications   Medication Sig Start Date End Date Taking? Authorizing Provider  ferrous sulfate 325 (65 FE) MG tablet Take 1 tablet (325 mg total) by mouth 2 (two) times daily with a meal. 12/31/15   Montez Morita, CNM    Family History Family History  Problem Relation Age of Onset   Asthma Sister    Diabetes Paternal Aunt     Social History Social History   Tobacco Use   Smoking status: Former    Current packs/day: 0.00    Average packs/day: 0.3 packs/day for 3.0  years (0.8 ttl pk-yrs)    Types: Cigarettes    Start date: 10/07/2012    Quit date: 10/08/2015    Years since quitting: 7.5   Smokeless tobacco: Never  Substance Use Topics   Alcohol use: Yes    Alcohol/week: 8.0 standard drinks of alcohol    Types: 8 Glasses of wine per week   Drug use: No     Allergies   Benadryl [diphenhydramine]   Review of Systems Review of Systems  Constitutional:  Negative for activity change, appetite change, fatigue and fever.  Eyes:  Negative for visual disturbance.  Respiratory:  Negative for shortness of breath.   Cardiovascular:  Negative for chest pain, palpitations and leg swelling.  Neurological:  Negative for dizziness, light-headedness and headaches.     Physical Exam Triage Vital Signs ED Triage Vitals  Encounter Vitals Group     BP 04/08/23 1101 (!) 130/91     Systolic BP Percentile --      Diastolic BP Percentile --      Pulse Rate 04/08/23 1101 82  Resp 04/08/23 1101 16     Temp 04/08/23 1101 98.5 F (36.9 C)     Temp Source 04/08/23 1101 Oral     SpO2 04/08/23 1101 98 %     Weight --      Height --      Head Circumference --      Peak Flow --      Pain Score 04/08/23 1100 0     Pain Loc --      Pain Education --      Exclude from Growth Chart --    No data found.  Updated Vital Signs BP (!) 136/96 (BP Location: Left Arm)   Pulse 82   Temp 98.5 F (36.9 C) (Oral)   Resp 16   SpO2 98%   Visual Acuity Right Eye Distance:   Left Eye Distance:   Bilateral Distance:    Right Eye Near:   Left Eye Near:    Bilateral Near:     Physical Exam Vitals reviewed.  Constitutional:      General: She is awake. She is not in acute distress.    Appearance: Normal appearance. She is well-developed. She is not ill-appearing.     Comments: Very pleasant female appears stated age in no acute distress sitting comfortably in exam room  HENT:     Head: Normocephalic and atraumatic.  Cardiovascular:     Rate and Rhythm: Normal  rate and regular rhythm.     Heart sounds: Normal heart sounds, S1 normal and S2 normal. No murmur heard. Pulmonary:     Effort: Pulmonary effort is normal.     Breath sounds: Normal breath sounds. No wheezing, rhonchi or rales.     Comments: Clear to auscultation bilaterally Abdominal:     Palpations: Abdomen is soft.     Tenderness: There is no abdominal tenderness.  Psychiatric:        Behavior: Behavior is cooperative.      UC Treatments / Results  Labs (all labs ordered are listed, but only abnormal results are displayed) Labs Reviewed - No data to display  EKG   Radiology No results found.  Procedures Procedures (including critical care time)  Medications Ordered in UC Medications - No data to display  Initial Impression / Assessment and Plan / UC Course  I have reviewed the triage vital signs and the nursing notes.  Pertinent labs & imaging results that were available during my care of the patient were reviewed by me and considered in my medical decision making (see chart for details).     Patient is well-appearing, afebrile, nontoxic, nontachycardic.  She is mildly hypertensive but does report a significant amount of stress.  We discussed that this could be contributing to her elevated blood pressure readings in clinic.  She denies any signs/symptoms of endorgan damage.  Recommend that she avoid decongestants, caffeine, sodium.  Encouraged her to monitor her blood pressure regularly and if this remains above 130/85 we will consider initiation of medication.  She is welcome to return here if her blood pressure remains elevated.  She was provided the needed documentation to take to [facility] [employer].  All concerns were addressed.  Final Clinical Impressions(s) / UC Diagnoses   Final diagnoses:  Elevated blood pressure reading without diagnosis of hypertension  Situational stress     Discharge Instructions      Your blood pressure is little bit elevated today  but I believe this is related to situational stress.  Try to avoid  decongestants, caffeine, sodium, NSAIDs including aspirin, ibuprofen/Advil, naproxen/Aleve.  Make sure you are drinking plenty of fluid.  Monitor your blood pressure at home.  If this is persistently above 130/85 please return as we can consider medication.  If you develop any chest pain, shortness of breath, headache, vision change, dizziness in setting of high blood pressure you need to be seen immediately.    ED Prescriptions   None    PDMP not reviewed this encounter.   Jeani Hawking, PA-C 04/08/23 1139

## 2023-04-08 NOTE — ED Triage Notes (Addendum)
Pt here to get vitals checked. Denies having any symptoms.She was told to come here from social security to have information updated.
